# Patient Record
Sex: Male | Born: 1950 | Race: White | Hispanic: No | Smoking: Heavy tobacco smoker
Health system: Southern US, Community
[De-identification: ages and names within clinical notes are randomized; demographics above are authoritative.]

## PROBLEM LIST (undated history)

## (undated) DIAGNOSIS — E119 Type 2 diabetes mellitus without complications: Secondary | ICD-10-CM

## (undated) DIAGNOSIS — I10 Essential (primary) hypertension: Secondary | ICD-10-CM

## (undated) DIAGNOSIS — E785 Hyperlipidemia, unspecified: Secondary | ICD-10-CM

## (undated) HISTORY — PX: CORONARY ANGIOPLASTY WITH STENT PLACEMENT: SHX49

## (undated) HISTORY — PX: CORONARY ARTERY BYPASS GRAFT: SHX141

---

## 2014-11-29 DIAGNOSIS — E119 Type 2 diabetes mellitus without complications: Secondary | ICD-10-CM | POA: Diagnosis not present

## 2014-11-29 DIAGNOSIS — Z6828 Body mass index (BMI) 28.0-28.9, adult: Secondary | ICD-10-CM | POA: Diagnosis not present

## 2014-11-29 DIAGNOSIS — E785 Hyperlipidemia, unspecified: Secondary | ICD-10-CM | POA: Diagnosis not present

## 2014-11-29 DIAGNOSIS — I10 Essential (primary) hypertension: Secondary | ICD-10-CM | POA: Diagnosis not present

## 2015-01-04 DIAGNOSIS — E1165 Type 2 diabetes mellitus with hyperglycemia: Secondary | ICD-10-CM | POA: Diagnosis not present

## 2015-04-03 DIAGNOSIS — E785 Hyperlipidemia, unspecified: Secondary | ICD-10-CM | POA: Diagnosis not present

## 2015-04-03 DIAGNOSIS — I10 Essential (primary) hypertension: Secondary | ICD-10-CM | POA: Diagnosis not present

## 2015-04-03 DIAGNOSIS — N400 Enlarged prostate without lower urinary tract symptoms: Secondary | ICD-10-CM | POA: Diagnosis not present

## 2015-04-03 DIAGNOSIS — I2510 Atherosclerotic heart disease of native coronary artery without angina pectoris: Secondary | ICD-10-CM | POA: Diagnosis not present

## 2015-04-03 DIAGNOSIS — K210 Gastro-esophageal reflux disease with esophagitis: Secondary | ICD-10-CM | POA: Diagnosis not present

## 2015-04-03 DIAGNOSIS — I253 Aneurysm of heart: Secondary | ICD-10-CM | POA: Diagnosis not present

## 2015-04-03 DIAGNOSIS — E119 Type 2 diabetes mellitus without complications: Secondary | ICD-10-CM | POA: Diagnosis not present

## 2015-04-03 DIAGNOSIS — Z6826 Body mass index (BMI) 26.0-26.9, adult: Secondary | ICD-10-CM | POA: Diagnosis not present

## 2015-10-08 DIAGNOSIS — I10 Essential (primary) hypertension: Secondary | ICD-10-CM | POA: Diagnosis not present

## 2015-10-08 DIAGNOSIS — I2510 Atherosclerotic heart disease of native coronary artery without angina pectoris: Secondary | ICD-10-CM | POA: Diagnosis not present

## 2015-10-08 DIAGNOSIS — Z720 Tobacco use: Secondary | ICD-10-CM | POA: Diagnosis not present

## 2015-10-08 DIAGNOSIS — E785 Hyperlipidemia, unspecified: Secondary | ICD-10-CM | POA: Diagnosis not present

## 2015-10-08 DIAGNOSIS — E119 Type 2 diabetes mellitus without complications: Secondary | ICD-10-CM | POA: Diagnosis not present

## 2015-10-08 DIAGNOSIS — Z6826 Body mass index (BMI) 26.0-26.9, adult: Secondary | ICD-10-CM | POA: Diagnosis not present

## 2016-01-24 DIAGNOSIS — E119 Type 2 diabetes mellitus without complications: Secondary | ICD-10-CM | POA: Diagnosis not present

## 2016-01-25 DIAGNOSIS — Z6827 Body mass index (BMI) 27.0-27.9, adult: Secondary | ICD-10-CM | POA: Diagnosis not present

## 2016-01-25 DIAGNOSIS — Z1389 Encounter for screening for other disorder: Secondary | ICD-10-CM | POA: Diagnosis not present

## 2016-01-25 DIAGNOSIS — E663 Overweight: Secondary | ICD-10-CM | POA: Diagnosis not present

## 2016-01-25 DIAGNOSIS — E119 Type 2 diabetes mellitus without complications: Secondary | ICD-10-CM | POA: Diagnosis not present

## 2016-01-25 DIAGNOSIS — I2510 Atherosclerotic heart disease of native coronary artery without angina pectoris: Secondary | ICD-10-CM | POA: Diagnosis not present

## 2016-01-25 DIAGNOSIS — E785 Hyperlipidemia, unspecified: Secondary | ICD-10-CM | POA: Diagnosis not present

## 2016-01-25 DIAGNOSIS — I10 Essential (primary) hypertension: Secondary | ICD-10-CM | POA: Diagnosis not present

## 2016-05-27 DIAGNOSIS — E663 Overweight: Secondary | ICD-10-CM | POA: Diagnosis not present

## 2016-05-27 DIAGNOSIS — E119 Type 2 diabetes mellitus without complications: Secondary | ICD-10-CM | POA: Diagnosis not present

## 2016-05-27 DIAGNOSIS — Z6827 Body mass index (BMI) 27.0-27.9, adult: Secondary | ICD-10-CM | POA: Diagnosis not present

## 2016-05-27 DIAGNOSIS — Z1389 Encounter for screening for other disorder: Secondary | ICD-10-CM | POA: Diagnosis not present

## 2016-05-27 DIAGNOSIS — E785 Hyperlipidemia, unspecified: Secondary | ICD-10-CM | POA: Diagnosis not present

## 2016-05-27 DIAGNOSIS — I10 Essential (primary) hypertension: Secondary | ICD-10-CM | POA: Diagnosis not present

## 2016-09-30 DIAGNOSIS — E663 Overweight: Secondary | ICD-10-CM | POA: Diagnosis not present

## 2016-09-30 DIAGNOSIS — Z6827 Body mass index (BMI) 27.0-27.9, adult: Secondary | ICD-10-CM | POA: Diagnosis not present

## 2016-09-30 DIAGNOSIS — E119 Type 2 diabetes mellitus without complications: Secondary | ICD-10-CM | POA: Diagnosis not present

## 2016-09-30 DIAGNOSIS — I10 Essential (primary) hypertension: Secondary | ICD-10-CM | POA: Diagnosis not present

## 2017-01-29 DIAGNOSIS — E785 Hyperlipidemia, unspecified: Secondary | ICD-10-CM | POA: Diagnosis not present

## 2017-01-29 DIAGNOSIS — Z9181 History of falling: Secondary | ICD-10-CM | POA: Diagnosis not present

## 2017-01-29 DIAGNOSIS — I10 Essential (primary) hypertension: Secondary | ICD-10-CM | POA: Diagnosis not present

## 2017-01-29 DIAGNOSIS — Z6827 Body mass index (BMI) 27.0-27.9, adult: Secondary | ICD-10-CM | POA: Diagnosis not present

## 2017-01-29 DIAGNOSIS — E119 Type 2 diabetes mellitus without complications: Secondary | ICD-10-CM | POA: Diagnosis not present

## 2017-01-29 DIAGNOSIS — E663 Overweight: Secondary | ICD-10-CM | POA: Diagnosis not present

## 2017-01-29 DIAGNOSIS — K219 Gastro-esophageal reflux disease without esophagitis: Secondary | ICD-10-CM | POA: Diagnosis not present

## 2017-01-29 DIAGNOSIS — Z79899 Other long term (current) drug therapy: Secondary | ICD-10-CM | POA: Diagnosis not present

## 2017-03-06 DIAGNOSIS — Z6827 Body mass index (BMI) 27.0-27.9, adult: Secondary | ICD-10-CM | POA: Diagnosis not present

## 2017-03-06 DIAGNOSIS — Z7689 Persons encountering health services in other specified circumstances: Secondary | ICD-10-CM | POA: Diagnosis not present

## 2017-03-06 DIAGNOSIS — E663 Overweight: Secondary | ICD-10-CM | POA: Diagnosis not present

## 2017-03-06 DIAGNOSIS — I10 Essential (primary) hypertension: Secondary | ICD-10-CM | POA: Diagnosis not present

## 2017-03-06 DIAGNOSIS — E119 Type 2 diabetes mellitus without complications: Secondary | ICD-10-CM | POA: Diagnosis not present

## 2017-06-01 DIAGNOSIS — I10 Essential (primary) hypertension: Secondary | ICD-10-CM | POA: Diagnosis not present

## 2017-06-01 DIAGNOSIS — E119 Type 2 diabetes mellitus without complications: Secondary | ICD-10-CM | POA: Diagnosis not present

## 2017-06-04 DIAGNOSIS — Z6826 Body mass index (BMI) 26.0-26.9, adult: Secondary | ICD-10-CM | POA: Diagnosis not present

## 2017-06-04 DIAGNOSIS — K219 Gastro-esophageal reflux disease without esophagitis: Secondary | ICD-10-CM | POA: Diagnosis not present

## 2017-06-04 DIAGNOSIS — E119 Type 2 diabetes mellitus without complications: Secondary | ICD-10-CM | POA: Diagnosis not present

## 2017-06-04 DIAGNOSIS — Z1389 Encounter for screening for other disorder: Secondary | ICD-10-CM | POA: Diagnosis not present

## 2017-06-04 DIAGNOSIS — Z9181 History of falling: Secondary | ICD-10-CM | POA: Diagnosis not present

## 2017-06-04 DIAGNOSIS — I10 Essential (primary) hypertension: Secondary | ICD-10-CM | POA: Diagnosis not present

## 2017-11-11 DIAGNOSIS — E663 Overweight: Secondary | ICD-10-CM | POA: Diagnosis not present

## 2017-11-11 DIAGNOSIS — E785 Hyperlipidemia, unspecified: Secondary | ICD-10-CM | POA: Diagnosis not present

## 2017-11-11 DIAGNOSIS — Z6826 Body mass index (BMI) 26.0-26.9, adult: Secondary | ICD-10-CM | POA: Diagnosis not present

## 2017-11-11 DIAGNOSIS — E119 Type 2 diabetes mellitus without complications: Secondary | ICD-10-CM | POA: Diagnosis not present

## 2017-11-11 DIAGNOSIS — I10 Essential (primary) hypertension: Secondary | ICD-10-CM | POA: Diagnosis not present

## 2018-05-11 DIAGNOSIS — K219 Gastro-esophageal reflux disease without esophagitis: Secondary | ICD-10-CM | POA: Diagnosis not present

## 2018-05-11 DIAGNOSIS — I10 Essential (primary) hypertension: Secondary | ICD-10-CM | POA: Diagnosis not present

## 2018-05-11 DIAGNOSIS — E119 Type 2 diabetes mellitus without complications: Secondary | ICD-10-CM | POA: Diagnosis not present

## 2018-05-11 DIAGNOSIS — E785 Hyperlipidemia, unspecified: Secondary | ICD-10-CM | POA: Diagnosis not present

## 2018-11-23 DIAGNOSIS — E785 Hyperlipidemia, unspecified: Secondary | ICD-10-CM | POA: Diagnosis not present

## 2018-11-23 DIAGNOSIS — E119 Type 2 diabetes mellitus without complications: Secondary | ICD-10-CM | POA: Diagnosis not present

## 2018-11-23 DIAGNOSIS — Z1331 Encounter for screening for depression: Secondary | ICD-10-CM | POA: Diagnosis not present

## 2018-11-23 DIAGNOSIS — Z6824 Body mass index (BMI) 24.0-24.9, adult: Secondary | ICD-10-CM | POA: Diagnosis not present

## 2018-11-23 DIAGNOSIS — Z9181 History of falling: Secondary | ICD-10-CM | POA: Diagnosis not present

## 2018-11-23 DIAGNOSIS — I10 Essential (primary) hypertension: Secondary | ICD-10-CM | POA: Diagnosis not present

## 2019-06-02 DIAGNOSIS — Z139 Encounter for screening, unspecified: Secondary | ICD-10-CM | POA: Diagnosis not present

## 2019-06-02 DIAGNOSIS — H6123 Impacted cerumen, bilateral: Secondary | ICD-10-CM | POA: Diagnosis not present

## 2019-06-02 DIAGNOSIS — E118 Type 2 diabetes mellitus with unspecified complications: Secondary | ICD-10-CM | POA: Diagnosis not present

## 2019-06-02 DIAGNOSIS — I10 Essential (primary) hypertension: Secondary | ICD-10-CM | POA: Diagnosis not present

## 2019-06-02 DIAGNOSIS — E785 Hyperlipidemia, unspecified: Secondary | ICD-10-CM | POA: Diagnosis not present

## 2019-06-02 DIAGNOSIS — Z125 Encounter for screening for malignant neoplasm of prostate: Secondary | ICD-10-CM | POA: Diagnosis not present

## 2019-06-03 DIAGNOSIS — H6123 Impacted cerumen, bilateral: Secondary | ICD-10-CM | POA: Diagnosis not present

## 2019-06-03 DIAGNOSIS — H9202 Otalgia, left ear: Secondary | ICD-10-CM | POA: Diagnosis not present

## 2019-07-04 DIAGNOSIS — Z6825 Body mass index (BMI) 25.0-25.9, adult: Secondary | ICD-10-CM | POA: Diagnosis not present

## 2019-07-04 DIAGNOSIS — I10 Essential (primary) hypertension: Secondary | ICD-10-CM | POA: Diagnosis not present

## 2019-07-14 DIAGNOSIS — H6123 Impacted cerumen, bilateral: Secondary | ICD-10-CM | POA: Diagnosis not present

## 2019-07-14 DIAGNOSIS — H61303 Acquired stenosis of external ear canal, unspecified, bilateral: Secondary | ICD-10-CM | POA: Diagnosis not present

## 2019-07-14 DIAGNOSIS — H9190 Unspecified hearing loss, unspecified ear: Secondary | ICD-10-CM | POA: Diagnosis not present

## 2019-07-26 DIAGNOSIS — H61303 Acquired stenosis of external ear canal, unspecified, bilateral: Secondary | ICD-10-CM | POA: Diagnosis not present

## 2019-07-26 DIAGNOSIS — H6123 Impacted cerumen, bilateral: Secondary | ICD-10-CM | POA: Diagnosis not present

## 2019-08-16 DIAGNOSIS — H6123 Impacted cerumen, bilateral: Secondary | ICD-10-CM | POA: Diagnosis not present

## 2019-10-07 DIAGNOSIS — I10 Essential (primary) hypertension: Secondary | ICD-10-CM | POA: Diagnosis not present

## 2019-10-07 DIAGNOSIS — Z720 Tobacco use: Secondary | ICD-10-CM | POA: Diagnosis not present

## 2019-10-07 DIAGNOSIS — E785 Hyperlipidemia, unspecified: Secondary | ICD-10-CM | POA: Diagnosis not present

## 2019-10-07 DIAGNOSIS — Z6825 Body mass index (BMI) 25.0-25.9, adult: Secondary | ICD-10-CM | POA: Diagnosis not present

## 2019-10-07 DIAGNOSIS — E118 Type 2 diabetes mellitus with unspecified complications: Secondary | ICD-10-CM | POA: Diagnosis not present

## 2020-01-05 DIAGNOSIS — E785 Hyperlipidemia, unspecified: Secondary | ICD-10-CM | POA: Diagnosis not present

## 2020-01-05 DIAGNOSIS — Z6825 Body mass index (BMI) 25.0-25.9, adult: Secondary | ICD-10-CM | POA: Diagnosis not present

## 2020-01-05 DIAGNOSIS — E118 Type 2 diabetes mellitus with unspecified complications: Secondary | ICD-10-CM | POA: Diagnosis not present

## 2020-01-05 DIAGNOSIS — I10 Essential (primary) hypertension: Secondary | ICD-10-CM | POA: Diagnosis not present

## 2020-04-17 DIAGNOSIS — I2510 Atherosclerotic heart disease of native coronary artery without angina pectoris: Secondary | ICD-10-CM | POA: Diagnosis not present

## 2020-04-17 DIAGNOSIS — E785 Hyperlipidemia, unspecified: Secondary | ICD-10-CM | POA: Diagnosis not present

## 2020-04-17 DIAGNOSIS — Z6826 Body mass index (BMI) 26.0-26.9, adult: Secondary | ICD-10-CM | POA: Diagnosis not present

## 2020-04-17 DIAGNOSIS — Z720 Tobacco use: Secondary | ICD-10-CM | POA: Diagnosis not present

## 2020-04-17 DIAGNOSIS — I252 Old myocardial infarction: Secondary | ICD-10-CM | POA: Diagnosis not present

## 2020-04-17 DIAGNOSIS — I10 Essential (primary) hypertension: Secondary | ICD-10-CM | POA: Diagnosis not present

## 2020-04-17 DIAGNOSIS — Z1331 Encounter for screening for depression: Secondary | ICD-10-CM | POA: Diagnosis not present

## 2020-04-17 DIAGNOSIS — E118 Type 2 diabetes mellitus with unspecified complications: Secondary | ICD-10-CM | POA: Diagnosis not present

## 2020-08-21 DIAGNOSIS — K219 Gastro-esophageal reflux disease without esophagitis: Secondary | ICD-10-CM | POA: Diagnosis not present

## 2020-08-21 DIAGNOSIS — I252 Old myocardial infarction: Secondary | ICD-10-CM | POA: Diagnosis not present

## 2020-08-21 DIAGNOSIS — Z720 Tobacco use: Secondary | ICD-10-CM | POA: Diagnosis not present

## 2020-08-21 DIAGNOSIS — I2510 Atherosclerotic heart disease of native coronary artery without angina pectoris: Secondary | ICD-10-CM | POA: Diagnosis not present

## 2020-08-21 DIAGNOSIS — E118 Type 2 diabetes mellitus with unspecified complications: Secondary | ICD-10-CM | POA: Diagnosis not present

## 2020-08-21 DIAGNOSIS — E785 Hyperlipidemia, unspecified: Secondary | ICD-10-CM | POA: Diagnosis not present

## 2020-08-21 DIAGNOSIS — Z6825 Body mass index (BMI) 25.0-25.9, adult: Secondary | ICD-10-CM | POA: Diagnosis not present

## 2020-08-21 DIAGNOSIS — I10 Essential (primary) hypertension: Secondary | ICD-10-CM | POA: Diagnosis not present

## 2020-10-11 ENCOUNTER — Encounter (HOSPITAL_COMMUNITY): Payer: Self-pay | Admitting: Emergency Medicine

## 2020-10-11 ENCOUNTER — Inpatient Hospital Stay (HOSPITAL_COMMUNITY)
Admission: EM | Admit: 2020-10-11 | Discharge: 2020-11-20 | DRG: 177 | Disposition: E | Payer: Medicare Other | Attending: Family Medicine | Admitting: Family Medicine

## 2020-10-11 ENCOUNTER — Emergency Department (HOSPITAL_COMMUNITY): Payer: Medicare Other

## 2020-10-11 ENCOUNTER — Other Ambulatory Visit: Payer: Self-pay

## 2020-10-11 DIAGNOSIS — R5381 Other malaise: Secondary | ICD-10-CM | POA: Diagnosis not present

## 2020-10-11 DIAGNOSIS — E538 Deficiency of other specified B group vitamins: Secondary | ICD-10-CM | POA: Diagnosis not present

## 2020-10-11 DIAGNOSIS — I959 Hypotension, unspecified: Secondary | ICD-10-CM | POA: Diagnosis not present

## 2020-10-11 DIAGNOSIS — J1282 Pneumonia due to coronavirus disease 2019: Secondary | ICD-10-CM | POA: Diagnosis present

## 2020-10-11 DIAGNOSIS — E861 Hypovolemia: Secondary | ICD-10-CM | POA: Diagnosis present

## 2020-10-11 DIAGNOSIS — R531 Weakness: Secondary | ICD-10-CM | POA: Diagnosis not present

## 2020-10-11 DIAGNOSIS — G928 Other toxic encephalopathy: Secondary | ICD-10-CM | POA: Diagnosis not present

## 2020-10-11 DIAGNOSIS — E785 Hyperlipidemia, unspecified: Secondary | ICD-10-CM | POA: Diagnosis present

## 2020-10-11 DIAGNOSIS — N189 Chronic kidney disease, unspecified: Secondary | ICD-10-CM

## 2020-10-11 DIAGNOSIS — J159 Unspecified bacterial pneumonia: Secondary | ICD-10-CM | POA: Diagnosis present

## 2020-10-11 DIAGNOSIS — R0902 Hypoxemia: Secondary | ICD-10-CM

## 2020-10-11 DIAGNOSIS — H748X3 Other specified disorders of middle ear and mastoid, bilateral: Secondary | ICD-10-CM | POA: Diagnosis not present

## 2020-10-11 DIAGNOSIS — I82611 Acute embolism and thrombosis of superficial veins of right upper extremity: Secondary | ICD-10-CM | POA: Diagnosis not present

## 2020-10-11 DIAGNOSIS — Z7982 Long term (current) use of aspirin: Secondary | ICD-10-CM

## 2020-10-11 DIAGNOSIS — J9601 Acute respiratory failure with hypoxia: Secondary | ICD-10-CM | POA: Diagnosis not present

## 2020-10-11 DIAGNOSIS — E1122 Type 2 diabetes mellitus with diabetic chronic kidney disease: Secondary | ICD-10-CM | POA: Diagnosis not present

## 2020-10-11 DIAGNOSIS — Z7189 Other specified counseling: Secondary | ICD-10-CM | POA: Diagnosis not present

## 2020-10-11 DIAGNOSIS — K219 Gastro-esophageal reflux disease without esophagitis: Secondary | ICD-10-CM | POA: Diagnosis present

## 2020-10-11 DIAGNOSIS — Z79899 Other long term (current) drug therapy: Secondary | ICD-10-CM | POA: Diagnosis not present

## 2020-10-11 DIAGNOSIS — R609 Edema, unspecified: Secondary | ICD-10-CM | POA: Diagnosis not present

## 2020-10-11 DIAGNOSIS — Z888 Allergy status to other drugs, medicaments and biological substances status: Secondary | ICD-10-CM | POA: Diagnosis not present

## 2020-10-11 DIAGNOSIS — U071 COVID-19: Secondary | ICD-10-CM | POA: Diagnosis not present

## 2020-10-11 DIAGNOSIS — N183 Chronic kidney disease, stage 3 unspecified: Secondary | ICD-10-CM

## 2020-10-11 DIAGNOSIS — Z515 Encounter for palliative care: Secondary | ICD-10-CM | POA: Diagnosis not present

## 2020-10-11 DIAGNOSIS — Z66 Do not resuscitate: Secondary | ICD-10-CM | POA: Diagnosis not present

## 2020-10-11 DIAGNOSIS — E870 Hyperosmolality and hypernatremia: Secondary | ICD-10-CM | POA: Diagnosis present

## 2020-10-11 DIAGNOSIS — E860 Dehydration: Secondary | ICD-10-CM | POA: Diagnosis present

## 2020-10-11 DIAGNOSIS — R410 Disorientation, unspecified: Secondary | ICD-10-CM | POA: Diagnosis not present

## 2020-10-11 DIAGNOSIS — G9341 Metabolic encephalopathy: Secondary | ICD-10-CM | POA: Diagnosis not present

## 2020-10-11 DIAGNOSIS — Z7984 Long term (current) use of oral hypoglycemic drugs: Secondary | ICD-10-CM

## 2020-10-11 DIAGNOSIS — E876 Hypokalemia: Secondary | ICD-10-CM | POA: Diagnosis not present

## 2020-10-11 DIAGNOSIS — E1165 Type 2 diabetes mellitus with hyperglycemia: Secondary | ICD-10-CM | POA: Diagnosis not present

## 2020-10-11 DIAGNOSIS — Z951 Presence of aortocoronary bypass graft: Secondary | ICD-10-CM | POA: Diagnosis not present

## 2020-10-11 DIAGNOSIS — F17210 Nicotine dependence, cigarettes, uncomplicated: Secondary | ICD-10-CM | POA: Diagnosis present

## 2020-10-11 DIAGNOSIS — J189 Pneumonia, unspecified organism: Secondary | ICD-10-CM | POA: Diagnosis not present

## 2020-10-11 DIAGNOSIS — G319 Degenerative disease of nervous system, unspecified: Secondary | ICD-10-CM | POA: Diagnosis not present

## 2020-10-11 DIAGNOSIS — D6959 Other secondary thrombocytopenia: Secondary | ICD-10-CM | POA: Diagnosis present

## 2020-10-11 DIAGNOSIS — J690 Pneumonitis due to inhalation of food and vomit: Secondary | ICD-10-CM | POA: Diagnosis not present

## 2020-10-11 DIAGNOSIS — I252 Old myocardial infarction: Secondary | ICD-10-CM | POA: Diagnosis not present

## 2020-10-11 DIAGNOSIS — N179 Acute kidney failure, unspecified: Secondary | ICD-10-CM | POA: Diagnosis present

## 2020-10-11 DIAGNOSIS — I10 Essential (primary) hypertension: Secondary | ICD-10-CM | POA: Diagnosis present

## 2020-10-11 DIAGNOSIS — R509 Fever, unspecified: Secondary | ICD-10-CM | POA: Diagnosis not present

## 2020-10-11 DIAGNOSIS — R059 Cough, unspecified: Secondary | ICD-10-CM | POA: Diagnosis not present

## 2020-10-11 DIAGNOSIS — R7989 Other specified abnormal findings of blood chemistry: Secondary | ICD-10-CM | POA: Diagnosis not present

## 2020-10-11 DIAGNOSIS — I517 Cardiomegaly: Secondary | ICD-10-CM | POA: Diagnosis not present

## 2020-10-11 DIAGNOSIS — R0602 Shortness of breath: Secondary | ICD-10-CM

## 2020-10-11 DIAGNOSIS — I472 Ventricular tachycardia: Secondary | ICD-10-CM | POA: Diagnosis not present

## 2020-10-11 DIAGNOSIS — R001 Bradycardia, unspecified: Secondary | ICD-10-CM | POA: Diagnosis not present

## 2020-10-11 DIAGNOSIS — J3489 Other specified disorders of nose and nasal sinuses: Secondary | ICD-10-CM | POA: Diagnosis not present

## 2020-10-11 DIAGNOSIS — R1310 Dysphagia, unspecified: Secondary | ICD-10-CM | POA: Diagnosis not present

## 2020-10-11 DIAGNOSIS — E119 Type 2 diabetes mellitus without complications: Secondary | ICD-10-CM | POA: Diagnosis not present

## 2020-10-11 DIAGNOSIS — R4182 Altered mental status, unspecified: Secondary | ICD-10-CM | POA: Diagnosis not present

## 2020-10-11 HISTORY — DX: Hyperlipidemia, unspecified: E78.5

## 2020-10-11 HISTORY — DX: Type 2 diabetes mellitus without complications: E11.9

## 2020-10-11 HISTORY — DX: Essential (primary) hypertension: I10

## 2020-10-11 LAB — D-DIMER, QUANTITATIVE
D-Dimer, Quant: 1.48 ug/mL-FEU — ABNORMAL HIGH (ref 0.00–0.50)
D-Dimer, Quant: 1.6 ug/mL-FEU — ABNORMAL HIGH (ref 0.00–0.50)

## 2020-10-11 LAB — FERRITIN: Ferritin: 343 ng/mL — ABNORMAL HIGH (ref 24–336)

## 2020-10-11 LAB — CBC WITH DIFFERENTIAL/PLATELET
Abs Immature Granulocytes: 0.21 10*3/uL — ABNORMAL HIGH (ref 0.00–0.07)
Basophils Absolute: 0 10*3/uL (ref 0.0–0.1)
Basophils Relative: 0 %
Eosinophils Absolute: 0 10*3/uL (ref 0.0–0.5)
Eosinophils Relative: 0 %
HCT: 36.3 % — ABNORMAL LOW (ref 39.0–52.0)
Hemoglobin: 13 g/dL (ref 13.0–17.0)
Immature Granulocytes: 3 %
Lymphocytes Relative: 19 %
Lymphs Abs: 1.4 10*3/uL (ref 0.7–4.0)
MCH: 38.6 pg — ABNORMAL HIGH (ref 26.0–34.0)
MCHC: 35.8 g/dL (ref 30.0–36.0)
MCV: 107.7 fL — ABNORMAL HIGH (ref 80.0–100.0)
Monocytes Absolute: 0.7 10*3/uL (ref 0.1–1.0)
Monocytes Relative: 9 %
Neutro Abs: 5.3 10*3/uL (ref 1.7–7.7)
Neutrophils Relative %: 69 %
Platelets: 125 10*3/uL — ABNORMAL LOW (ref 150–400)
RBC: 3.37 MIL/uL — ABNORMAL LOW (ref 4.22–5.81)
RDW: 18.7 % — ABNORMAL HIGH (ref 11.5–15.5)
WBC: 7.6 10*3/uL (ref 4.0–10.5)
nRBC: 0.9 % — ABNORMAL HIGH (ref 0.0–0.2)

## 2020-10-11 LAB — COMPREHENSIVE METABOLIC PANEL
ALT: 18 U/L (ref 0–44)
AST: 23 U/L (ref 15–41)
Albumin: 3 g/dL — ABNORMAL LOW (ref 3.5–5.0)
Alkaline Phosphatase: 21 U/L — ABNORMAL LOW (ref 38–126)
Anion gap: 16 — ABNORMAL HIGH (ref 5–15)
BUN: 53 mg/dL — ABNORMAL HIGH (ref 8–23)
CO2: 23 mmol/L (ref 22–32)
Calcium: 8.9 mg/dL (ref 8.9–10.3)
Chloride: 107 mmol/L (ref 98–111)
Creatinine, Ser: 2.64 mg/dL — ABNORMAL HIGH (ref 0.61–1.24)
GFR, Estimated: 25 mL/min — ABNORMAL LOW (ref >60)
Glucose, Bld: 149 mg/dL — ABNORMAL HIGH (ref 70–99)
Potassium: 3.6 mmol/L (ref 3.5–5.1)
Sodium: 146 mmol/L — ABNORMAL HIGH (ref 135–145)
Total Bilirubin: 1.6 mg/dL — ABNORMAL HIGH (ref 0.3–1.2)
Total Protein: 6.7 g/dL (ref 6.5–8.1)

## 2020-10-11 LAB — TRIGLYCERIDES: Triglycerides: 269 mg/dL — ABNORMAL HIGH (ref <150)

## 2020-10-11 LAB — PROCALCITONIN: Procalcitonin: 0.2 ng/mL

## 2020-10-11 LAB — C-REACTIVE PROTEIN: CRP: 22.7 mg/dL — ABNORMAL HIGH (ref <1.0)

## 2020-10-11 LAB — FIBRINOGEN
Fibrinogen: 473 mg/dL (ref 210–475)
Fibrinogen: 402 mg/dL (ref 210–475)

## 2020-10-11 LAB — LACTIC ACID, PLASMA: Lactic Acid, Venous: 1.2 mmol/L (ref 0.5–1.9)

## 2020-10-11 LAB — LACTATE DEHYDROGENASE: LDH: 167 U/L (ref 98–192)

## 2020-10-11 LAB — SARS CORONAVIRUS 2 BY RT PCR (HOSPITAL ORDER, PERFORMED IN ~~LOC~~ HOSPITAL LAB): SARS Coronavirus 2: POSITIVE — AB

## 2020-10-11 MED ORDER — POLYETHYLENE GLYCOL 3350 17 G PO PACK: 17.0000 g | PACK | Freq: Every day | ORAL | Status: DC | PRN

## 2020-10-11 MED ORDER — METHYLPREDNISOLONE SODIUM SUCC 125 MG IJ SOLR
1.0000 mg/kg | Freq: Two times a day (BID) | INTRAMUSCULAR | Status: DC
  Administered 2020-10-11 – 2020-10-12 (×2): 79.375 mg via INTRAVENOUS
  Filled 2020-10-11 (×2): qty 2

## 2020-10-11 MED ORDER — PREDNISONE 20 MG PO TABS: 50.0000 mg | ORAL_TABLET | Freq: Every day | ORAL | Status: DC

## 2020-10-11 MED ORDER — LABETALOL HCL 5 MG/ML IV SOLN: 20.0000 mg | INTRAVENOUS | Status: DC | PRN

## 2020-10-11 MED ORDER — METOPROLOL SUCCINATE ER 25 MG PO TB24
25.0000 mg | ORAL_TABLET | Freq: Two times a day (BID) | ORAL | Status: DC
  Administered 2020-10-11 – 2020-10-14 (×7): 25 mg via ORAL
  Filled 2020-10-11 (×8): qty 1

## 2020-10-11 MED ORDER — GUAIFENESIN ER 600 MG PO TB12
600.0000 mg | ORAL_TABLET | Freq: Two times a day (BID) | ORAL | Status: DC
  Administered 2020-10-11 – 2020-10-14 (×7): 600 mg via ORAL
  Filled 2020-10-11 (×8): qty 1

## 2020-10-11 MED ORDER — TRAZODONE HCL 50 MG PO TABS: 25.0000 mg | ORAL_TABLET | Freq: Every evening | ORAL | Status: DC | PRN

## 2020-10-11 MED ORDER — ENOXAPARIN SODIUM 30 MG/0.3ML ~~LOC~~ SOLN
30.0000 mg | SUBCUTANEOUS | Status: DC
  Administered 2020-10-11: 30 mg via SUBCUTANEOUS
  Filled 2020-10-11: qty 0.3

## 2020-10-11 MED ORDER — ASPIRIN 325 MG PO TABS
325.0000 mg | ORAL_TABLET | Freq: Every day | ORAL | Status: DC
  Administered 2020-10-12 – 2020-10-14 (×3): 325 mg via ORAL
  Filled 2020-10-11 (×4): qty 1

## 2020-10-11 MED ORDER — DEXAMETHASONE SODIUM PHOSPHATE 10 MG/ML IJ SOLN
8.0000 mg | Freq: Once | INTRAMUSCULAR | Status: AC
  Administered 2020-10-11: 8 mg via INTRAVENOUS
  Filled 2020-10-11: qty 1

## 2020-10-11 MED ORDER — PANTOPRAZOLE SODIUM 20 MG PO TBEC
20.0000 mg | DELAYED_RELEASE_TABLET | Freq: Every day | ORAL | Status: DC
Start: 2020-10-12 — End: 2020-10-15
  Administered 2020-10-12 – 2020-10-14 (×3): 20 mg via ORAL
  Filled 2020-10-11 (×4): qty 1

## 2020-10-11 MED ORDER — MAGNESIUM HYDROXIDE 400 MG/5ML PO SUSP: 30.0000 mL | Freq: Every day | ORAL | Status: DC | PRN

## 2020-10-11 MED ORDER — FAMOTIDINE 20 MG PO TABS
20.0000 mg | ORAL_TABLET | Freq: Two times a day (BID) | ORAL | Status: DC
  Administered 2020-10-11 – 2020-10-14 (×7): 20 mg via ORAL
  Filled 2020-10-11 (×8): qty 1

## 2020-10-11 MED ORDER — ONDANSETRON HCL 4 MG/2ML IJ SOLN: 4.0000 mg | Freq: Four times a day (QID) | INTRAMUSCULAR | Status: DC | PRN

## 2020-10-11 MED ORDER — ONDANSETRON HCL 4 MG PO TABS: 4.0000 mg | ORAL_TABLET | Freq: Four times a day (QID) | ORAL | Status: DC | PRN

## 2020-10-11 MED ORDER — LOSARTAN POTASSIUM-HCTZ 50-12.5 MG PO TABS: 1.0000 | ORAL_TABLET | Freq: Every day | ORAL | Status: DC

## 2020-10-11 MED ORDER — FENOFIBRATE 160 MG PO TABS
160.0000 mg | ORAL_TABLET | Freq: Every day | ORAL | Status: DC
  Administered 2020-10-12 – 2020-10-14 (×3): 160 mg via ORAL
  Filled 2020-10-11 (×4): qty 1

## 2020-10-11 MED ORDER — ASCORBIC ACID 500 MG PO TABS
500.0000 mg | ORAL_TABLET | Freq: Every day | ORAL | Status: DC
  Administered 2020-10-12 – 2020-10-14 (×3): 500 mg via ORAL
  Filled 2020-10-11 (×4): qty 1

## 2020-10-11 MED ORDER — HYDROCOD POLST-CPM POLST ER 10-8 MG/5ML PO SUER
5.0000 mL | Freq: Two times a day (BID) | ORAL | Status: DC | PRN
  Administered 2020-10-13: 5 mL via ORAL
  Filled 2020-10-11: qty 5

## 2020-10-11 MED ORDER — VITAMIN D 25 MCG (1000 UNIT) PO TABS
1000.0000 [IU] | ORAL_TABLET | Freq: Every day | ORAL | Status: DC
  Administered 2020-10-11 – 2020-10-14 (×4): 1000 [IU] via ORAL
  Filled 2020-10-11 (×4): qty 1

## 2020-10-11 MED ORDER — HYDROCHLOROTHIAZIDE 12.5 MG PO CAPS: 12.5000 mg | ORAL_CAPSULE | Freq: Every day | ORAL | Status: DC

## 2020-10-11 MED ORDER — LOSARTAN POTASSIUM 50 MG PO TABS: 50.0000 mg | ORAL_TABLET | Freq: Every day | ORAL | Status: DC

## 2020-10-11 MED ORDER — SODIUM CHLORIDE 0.9 % IV SOLN
200.0000 mg | Freq: Once | INTRAVENOUS | Status: AC
  Administered 2020-10-11: 200 mg via INTRAVENOUS
  Filled 2020-10-11: qty 40

## 2020-10-11 MED ORDER — ASPIRIN EC 81 MG PO TBEC: 81.0000 mg | DELAYED_RELEASE_TABLET | Freq: Every day | ORAL | Status: DC

## 2020-10-11 MED ORDER — GUAIFENESIN-DM 100-10 MG/5ML PO SYRP
10.0000 mL | ORAL_SOLUTION | ORAL | Status: DC | PRN
  Administered 2020-10-13 – 2020-10-14 (×2): 10 mL via ORAL
  Filled 2020-10-11 (×3): qty 10

## 2020-10-11 MED ORDER — ZINC SULFATE 220 (50 ZN) MG PO CAPS
220.0000 mg | ORAL_CAPSULE | Freq: Every day | ORAL | Status: DC
  Administered 2020-10-12 – 2020-10-14 (×3): 220 mg via ORAL
  Filled 2020-10-11 (×4): qty 1

## 2020-10-11 MED ORDER — OMEGA-3-ACID ETHYL ESTERS 1 G PO CAPS
1.0000 g | ORAL_CAPSULE | Freq: Two times a day (BID) | ORAL | Status: DC
  Administered 2020-10-11 – 2020-10-14 (×6): 1 g via ORAL
  Filled 2020-10-11 (×9): qty 1

## 2020-10-11 MED ORDER — SODIUM CHLORIDE 0.9 % IV SOLN: INTRAVENOUS | Status: DC

## 2020-10-11 MED ORDER — SODIUM CHLORIDE 0.9 % IV SOLN
100.0000 mg | Freq: Every day | INTRAVENOUS | Status: AC
  Administered 2020-10-12 – 2020-10-15 (×4): 100 mg via INTRAVENOUS
  Filled 2020-10-11 (×4): qty 20

## 2020-10-11 MED ORDER — LACTATED RINGERS IV BOLUS
500.0000 mL | Freq: Once | INTRAVENOUS | Status: AC
  Administered 2020-10-11: 500 mL via INTRAVENOUS

## 2020-10-11 NOTE — ED Triage Notes (Signed)
Pt arrives to ED with via [facility] with c/o x5 days of weakness, lethargy, and failure to thrive. Per family pt has not been eating or moving around. Pt states he has had runny nose, sore throat and cough x5 days.

## 2020-10-11 NOTE — H&P (Addendum)
[facility]   PATIENT NAME: Joseph Crawford    MR#:  [id number]  DATE OF BIRTH:  Feb 14, 1951  DATE OF ADMISSION:  10/21/2020  PRIMARY CARE PHYSICIAN: No primary care provider on file.   REQUESTING/REFERRING PHYSICIAN: Gailen Shelter, PA  CHIEF COMPLAINT:   Chief Complaint  Patient presents with  . Weakness  . Fatigue    HISTORY OF PRESENT ILLNESS:  Joseph Crawford  is a 70 y.o. 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  He denied any nausea or vomiting or diarrhea.  No loss of taste or smell.  Has been feeling weak and tired with diminished appetite.  No chest pain or palpitations.  No dysuria, oliguria or hematuria or flank pain.  The patient has not been vaccinated against COVID-19.  Upon presentation to the ER, blood pressure was 91/60 with a respiratory rate of 33 with otherwise normal vital signs.  Pulse oximetry later dropped to 86% on room air.  Later on blood pressure was up to 117/59 and pulse oximetry was up to 100% on 2 L of O2 by nasal cannula.  Labs revealed mild hyponatremia 146 and a BUN of 53 with creatinine of 2.64 with no previous levels for comparison.  Ferritin was 343 and CRP 22.7 with procalcitonin 0.2 and CBC showed macrocytosis and thrombocytopenia.  D-dimer came back 1.48 and fibrinogen 473.  Blood cultures were drawn.  COVID-19 PCR came back positive.  Chest x-ray showed vague patchy airspace opacities in the left base.EKG showed normal sinus rhythm rate of 72 with nonspecific intraventricular conduction delay and T wave inversion laterally and inferiorly.  The patient was given 8 mg of IV Decadron, 500 mL IV lactated Ringer and IV remdesivir.  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  PAST SURGICAL HISTORY:  Four-vessel CABG  SOCIAL HISTORY:   Social History   Tobacco Use  . Smoking status: Not on file  . Smokeless tobacco: Not on file  Substance Use Topics  . Alcohol use: Not on file  He smokes less than half pack of cigarettes per day and has been smoking for long time.  No history of alcohol abuse or illicit drug use.  FAMILY HISTORY:  He denied any familial diseases.  DRUG ALLERGIES:   Allergies  Allergen Reactions  . Statins Other (See Comments)    Per pt: muscle loss and weakness     REVIEW OF SYSTEMS:   As per history of present illness. All pertinent systems were reviewed above. Constitutional, HEENT, cardiovascular, respiratory, GI, GU, musculoskeletal, neuro, psychiatric, endocrine, integumentary and hematologic systems were reviewed and are otherwise negative/unremarkable except for positive findings mentioned above in the HPI.   MEDICATIONS AT HOME:   Prior to Admission medications   Medication Sig Start Date End Date Taking? Authorizing Provider  aspirin 325 MG tablet Take 325 mg by mouth daily.   Yes [provider]  fenofibrate 160 MG tablet Take 160 mg by mouth daily. 09/18/20  Yes [provider]  losartan-hydrochlorothiazide (HYZAAR) 50-12.5 MG tablet Take 1 tablet by mouth daily.  10/03/20  Yes [provider]  metFORMIN (GLUCOPHAGE) 850 MG tablet Take 850 mg by mouth 2 (two) times daily. 07/20/20  Yes [provider]  metoprolol succinate (TOPROL-XL) 25 MG 24 hr tablet Take 25 mg by mouth 2 (two) times daily. 08/04/20  Yes [provider]  Omega-3 Fatty Acids (FISH OIL PO) Take 3 capsules by mouth daily.   Yes [provider]  pantoprazole (PROTONIX) 20 MG tablet Take  20 mg by mouth daily. 08/04/20  Yes [provider]      VITAL SIGNS:  Blood pressure 122/67, pulse 70, temperature 97.8 F (36.6 C), temperature source Oral, resp. rate (!) 24, height 6' (1.829 m), weight 79.4 kg, SpO2 99 %.  PHYSICAL EXAMINATION:  Physical Exam  GENERAL:  70 y.o.-year-old Caucasian male patient lying in the bed with mild respiratory distress with conversational dyspnea. EYES: Pupils equal, round, reactive to light and accommodation. No scleral icterus. Extraocular muscles intact.  HEENT: Head atraumatic, normocephalic. Oropharynx and nasopharynx clear.  NECK:  Supple, no jugular venous distention. No thyroid enlargement, no tenderness.  LUNGS: Diminished bibasal breath sounds with bibasal crackles more on the left base. CARDIOVASCULAR: Regular rate and rhythm, S1, S2 normal. No murmurs, rubs, or gallops.  ABDOMEN: Soft, nondistended, nontender. Bowel sounds present. No organomegaly or mass.  EXTREMITIES: No pedal edema, cyanosis, or clubbing.  NEUROLOGIC: Cranial nerves II through XII are intact. Muscle strength 5/5 in all extremities. Sensation intact. Gait not checked.  PSYCHIATRIC: The patient is alert and oriented x 3.  Normal affect and good eye contact. SKIN: No obvious rash, lesion, or ulcer.   LABORATORY PANEL:   CBC Recent Labs  Lab 2020-10-31 1612  WBC 7.6  HGB 13.0  HCT 36.3*  PLT 125*   ------------------------------------------------------------------------------------------------------------------  Chemistries  Recent Labs  Lab 10/31/20 1612  NA 146*  K 3.6  CL 107  CO2 23  GLUCOSE 149*  BUN 53*  CREATININE 2.64*  CALCIUM 8.9  AST 23  ALT 18  ALKPHOS 21*  BILITOT 1.6*   ------------------------------------------------------------------------------------------------------------------  Cardiac Enzymes No results for input(s): TROPONINI in the last 168  hours. ------------------------------------------------------------------------------------------------------------------  RADIOLOGY:  DG Chest Port 1 View  Result Date: 2020/10/31 CLINICAL DATA:  Shortness of breath, cough positive EXAM: PORTABLE CHEST 1 VIEW COMPARISON:  Chest x-ray 06/01/2013 FINDINGS: The heart size and mediastinal contours are unchanged. Aortic arch calcifications. Suggestion of a round well-defined 9 mm lesion overlying the right mid lung zone and ribs that is likely benign. Vague patchy airspace opacities at the left base. No focal consolidation. No pulmonary edema. No pleural effusion. No pneumothorax. No acute osseous abnormality. IMPRESSION: Vague patchy airspace opacities at the left base that could represent a combination of atelectasis and or infection/inflammation. Followup PA and lateral chest X-ray is recommended in 3-4 weeks following therapy to ensure resolution and exclude underlying malignancy. Electronically Signed   By: Tish Frederickson M.D.   On: 10/31/20 15:55      IMPRESSION AND PLAN:   1.  Acute hypoxemic respiratory failure secondary to COVID-19. -The patient will be admitted to a medically monitored isolation bed. -O2 protocol will be followed to keep O2 saturation above 93.   2.  Multifocal pneumonia secondary to COVID-19. -The patient will be admitted to an isolation monitored bed with droplet and contact precautions. -Given multifocal pneumonia we will empirically place the patient on IV Rocephin and Zithromax for possible bacterial superinfection only with elevated Procalcitonin. -The patient will be placed on scheduled  Mucinex and as needed Tussionex. -We will avoid nebulization as much as we can, give bronchodilator MDI if needed, and with deterioration of oxygenation try to avoid BiPAP/CPAP if possible.    -Will obtain sputum Gram stain culture and sensitivity and follow blood cultures. -O2 protocol will be followed. -We will follow CRP,  ferritin, LDH and D-dimer. -Will follow manual differential for ANC/ALC ratio as well as follow troponin I and daily CBC with manual differential and CMP. - Will place the patient on IV Remdesivir and IV steroid therapy with IV Solu-Medrol with elevated inflammatory markers. -The patient will be placed on vitamin D3, vitamin C, zinc sulfate, p.o. Pepcid and aspirin.  3.  Acute kidney injury, likely prerenal, likely hypovolemic, possibly superimposed on chronic kidney disease. - The patient will be placed on hydration with IV normal saline and will follow BMP. - We will hold off nephrotoxins.  4.  Type 2 diabetes mellitus possibly with chronic kidney disease. - The patient will be placed on supplement coverage with NovoLog. - We will hold off his metformin.  5.  Dyslipidemia. - We will continue fenofibrate and fish oil.  6.  Hypertension. - We will continue his Toprol-XL and hold off his Hyzaar.  7.  GERD. - We will continue PPI therapy.  8.  DVT prophylaxis. - Subcutaneous Lovenox.    All the records are reviewed and case discussed with ED provider. The plan of care was discussed in details with the patient (and family). I answered all questions. The patient agreed to proceed with the above mentioned plan. Further management will depend upon hospital course.   CODE STATUS: Full code  Status is: Inpatient  Remains inpatient appropriate because:Ongoing diagnostic testing needed not appropriate for outpatient work up, Unsafe d/c plan, IV treatments appropriate due to intensity of illness or inability to take PO and Inpatient level of care appropriate due to severity of illness   Dispo: The patient is from: Home              Anticipated d/c is to: Home              Anticipated d/c date is: > 3 days              Patient currently is not medically stable to d/c.   TOTAL TIME TAKING CARE OF THIS PATIENT: 55 minutes.    Hannah Beat M.D on 2020-11-02 at 8:11 PM  [facility]   From 7 PM-7 AM, contact night-coverage [email or web address]  CC: Primary care physician; No primary care provider on file.

## 2020-10-11 NOTE — ED Notes (Signed)
Attempted to call report, receiving RN unavailable at this time. Number left, she is to call back.

## 2020-10-11 NOTE — ED Notes (Signed)
Transport requested for patient.  

## 2020-10-11 NOTE — ED Provider Notes (Signed)
[facility] EMERGENCY DEPARTMENT Provider Note   CSN: [id number] Arrival date & time: 08/31/21  1353     History Chief Complaint  Patient presents with   Weakness   Fatigue    Rosine AbeBruce Rasch is a 70 y.o. male.  HPI Patient is a 70 year old male with a history of , HTN, HLD, DM, MI (CABG ~8 years ago). No inhalers.   Discussed with Misty StanleyLisa who is patient's daughter at [phone] Per Misty StanleyLisa patient is a heavy smoker 1-2 packs/day for many years.  States that he has had some steady decrease in how much he is willing to eat this is been ongoing since September but she states over the past 7 days he has eaten significantly less food and is not eating anything over the past 24 hours.  She states that he has also started using a walker over the past few days was brought to his family medicine doctor and found to be orthostatic and had his blood pressure medicines decreased. States that today patient attempted to use the restroom but was unable to get his pants down and urinated in his pants. Patient is not vaccinated for COVID.  Daughter also states that he has been coughing somewhat more over the past week although he does have a chronic smoker's cough.  She states he has never worn oxygen before. Patient is brought in by [facility] found his oxygen to be in the 88-90% range.   5 days ago losartan/hctz was changed from 100 --> 25, metoprolol not changed.      History reviewed. No pertinent past medical history.  Patient Active Problem List   Diagnosis Date Noted   Acute hypoxemic respiratory failure due to COVID-19 [facility](HCC) 11-27-20    History reviewed. No pertinent surgical history.     History reviewed. No pertinent family history.     Home Medications Prior to Admission medications   Medication Sig Start Date End Date Taking? Authorizing Provider  aspirin 325 MG tablet Take 325 mg by mouth daily.   Yes [provider]  fenofibrate 160 MG tablet Take  160 mg by mouth daily. 09/18/20  Yes [provider]  losartan-hydrochlorothiazide (HYZAAR) 50-12.5 MG tablet Take 1 tablet by mouth daily. 10/03/20  Yes [provider]  metFORMIN (GLUCOPHAGE) 850 MG tablet Take 850 mg by mouth 2 (two) times daily. 07/20/20  Yes [provider]  metoprolol succinate (TOPROL-XL) 25 MG 24 hr tablet Take 25 mg by mouth 2 (two) times daily. 08/04/20  Yes [provider]  Omega-3 Fatty Acids (FISH OIL PO) Take 3 capsules by mouth daily.   Yes [provider]  pantoprazole (PROTONIX) 20 MG tablet Take 20 mg by mouth daily. 08/04/20  Yes [provider]    Allergies    Statins  Review of Systems   Review of Systems  Constitutional: Positive for activity change, appetite change and fatigue. Negative for chills and fever.  HENT: Negative for congestion.   Eyes: Negative for pain.  Respiratory: Positive for cough. Negative for shortness of breath.   Cardiovascular: Negative for chest pain and leg swelling.  Gastrointestinal: Negative for abdominal pain, diarrhea, nausea and vomiting.  Genitourinary: Negative for dysuria.  Musculoskeletal: Negative for myalgias.  Skin: Negative for rash.  Neurological: Positive for weakness. Negative for dizziness and headaches.    Physical Exam Updated Vital Signs BP (!) 88/54    Pulse 65    Temp 97.8 F (36.6 C) (Oral)    Resp (!) 26  Ht 6' (1.829 m)    Wt 79.4 kg    SpO2 95%    BMI 23.73 kg/m   Physical Exam Vitals and nursing note reviewed.  Constitutional:      General: He is not in acute distress.    Comments: Severely fatigued ill-appearing 70 year old male in no acute distress  HENT:     Head: Normocephalic and atraumatic.     Nose: Nose normal.     Mouth/Throat:     Mouth: Mucous membranes are dry.  Eyes:     General: No scleral icterus. Cardiovascular:     Rate and Rhythm: Normal rate and regular rhythm.     Pulses: Normal pulses.     Heart sounds:  Normal heart sounds.  Pulmonary:     Effort: Pulmonary effort is normal. No respiratory distress.     Comments: Lungs with faint crackles in bilateral bases SPO2 88% on arrival in room increased to 96% with oxygen and repositioning. Abdominal:     Palpations: Abdomen is soft.     Tenderness: There is no abdominal tenderness. There is no guarding or rebound.  Musculoskeletal:     Cervical back: Normal range of motion.     Right lower leg: No edema.     Left lower leg: No edema.     Comments: No lower extremity edema  Skin:    General: Skin is warm and dry.     Capillary Refill: Capillary refill takes less than 2 seconds.  Neurological:     Mental Status: He is alert. Mental status is at baseline.  Psychiatric:        Mood and Affect: Mood normal.        Behavior: Behavior normal.     ED Results / Procedures / Treatments   Labs (all labs ordered are listed, but only abnormal results are displayed) Labs Reviewed  SARS CORONAVIRUS 2 BY RT PCR (HOSPITAL ORDER, PERFORMED IN [facility]) - Abnormal; Notable for the following components:      Result Value   SARS Coronavirus 2 POSITIVE (*)    All other components within normal limits  CBC WITH DIFFERENTIAL/PLATELET - Abnormal; Notable for the following components:   RBC 3.37 (*)    HCT 36.3 (*)    MCV 107.7 (*)    MCH 38.6 (*)    RDW 18.7 (*)    Platelets 125 (*)    nRBC 0.9 (*)    Abs Immature Granulocytes 0.21 (*)    All other components within normal limits  COMPREHENSIVE METABOLIC PANEL - Abnormal; Notable for the following components:   Sodium 146 (*)    Glucose, Bld 149 (*)    BUN 53 (*)    Creatinine, Ser 2.64 (*)    Albumin 3.0 (*)    Alkaline Phosphatase 21 (*)    Total Bilirubin 1.6 (*)    GFR, Estimated 25 (*)    Anion gap 16 (*)    All other components within normal limits  D-DIMER, QUANTITATIVE (NOT AT [facility]) - Abnormal; Notable for the following components:   D-Dimer, Quant 1.48 (*)    All other  components within normal limits  TRIGLYCERIDES - Abnormal; Notable for the following components:   Triglycerides 269 (*)    All other components within normal limits  CULTURE, BLOOD (ROUTINE X 2)  CULTURE, BLOOD (ROUTINE X 2)  LACTIC ACID, PLASMA  PROCALCITONIN  LACTATE DEHYDROGENASE  FIBRINOGEN  LACTIC ACID, PLASMA  FERRITIN  C-REACTIVE PROTEIN  URINALYSIS,  ROUTINE W REFLEX MICROSCOPIC    EKG EKG Interpretation  Date/Time:  Thursday October 11 2020 16:17:33 EST Ventricular Rate:  72 PR Interval:    QRS Duration: 125 QT Interval:  382 QTC Calculation: 418 R Axis:   78 Text Interpretation: Sinus rhythm Nonspecific intraventricular conduction delay Borderline repolarization abnormality Baseline wander in lead(s) V5 No old tracing to compare Confirmed by Susy Frizzle [phone]) on 10/06/2020 5:05:02 PM   Radiology DG Chest Port 1 View  Result Date: 10/21/2020 CLINICAL DATA:  Shortness of breath, cough positive EXAM: PORTABLE CHEST 1 VIEW COMPARISON:  Chest x-ray 06/01/2013 FINDINGS: The heart size and mediastinal contours are unchanged. Aortic arch calcifications. Suggestion of a round well-defined 9 mm lesion overlying the right mid lung zone and ribs that is likely benign. Vague patchy airspace opacities at the left base. No focal consolidation. No pulmonary edema. No pleural effusion. No pneumothorax. No acute osseous abnormality. IMPRESSION: Vague patchy airspace opacities at the left base that could represent a combination of atelectasis and or infection/inflammation. Followup PA and lateral chest X-ray is recommended in 3-4 weeks following therapy to ensure resolution and exclude underlying malignancy. Electronically Signed   By: Tish Frederickson M.D.   On: 09/30/2020 15:55    Procedures .Critical Care Performed by: Gailen Shelter, PA Authorized by: Gailen Shelter, PA   Critical care provider statement:    Critical care time (minutes):  35   Critical care time was  exclusive of:  Separately billable procedures and treating other patients and teaching time   Critical care was necessary to treat or prevent imminent or life-threatening deterioration of the following conditions:  Respiratory failure   Critical care was time spent personally by me on the following activities:  Discussions with consultants, evaluation of patient's response to treatment, examination of patient, review of old charts, re-evaluation of patient's condition, pulse oximetry, ordering and review of radiographic studies, ordering and review of laboratory studies and ordering and performing treatments and interventions   I assumed direction of critical care for this patient from another provider in my specialty: no     (including critical care time)  Medications Ordered in ED Medications  remdesivir 200 mg in sodium chloride 0.9% 250 mL IVPB (has no administration in time range)    Followed by  remdesivir 100 mg in sodium chloride 0.9 % 100 mL IVPB (has no administration in time range)  dexamethasone (DECADRON) injection 8 mg (8 mg Intravenous Given 10/19/2020 1741)  lactated ringers bolus 500 mL (500 mLs Intravenous New Bag/Given 10/16/2020 1832)    ED Course  I have reviewed the triage vital signs and the nursing notes.  Pertinent labs & imaging results that were available during my care of the patient were reviewed by me and considered in my medical decision making (see chart for details).  Patient is 70 year old male with past medical history detailed above presented today for fatigue.  Discussed with daughter who is able to fill me in on patient steady decline since September.  However he has had rapid decline over the past 7 days has become so fatigued that he has peed his pants as well as had to use a walker for the past several days.  He is having decreased fluid intake as well as decreased food intake.  On my exam patient is severely fatigued and keeps his eyes closed for most of the  encounter although he does open them when asked to. He is symmetrically weak however.  No focal  weakness no neurologic symptoms.  Is overall nontoxic-appearing.  His levels have remained within normal limits on 2 L.  He is mildly tachypneic however.  Clinical Course as of 09/27/2020 1856  Thu Oct 11, 2020  1829 Pt admitted for COVID pneumonia hypoxia discussed with hospitalist.  [WF]    Clinical Course User Index [WF] Gailen Shelter, [place]   CBC without leukocytosis.  No significant anemia.  Lactic within normal limits.  Procalcitonin within normal limits.  Low suspicion for bacterial pneumonia.  CMP with mildly elevated sodium likely due to some mild dehydration will provide patient with 500 mL bolus over 2 hours.  For rehydration.  Also with some borderline soft blood pressures.  Mentating well.  CMP with creatinine and BUN that are elevated will likely benefit from continued fluids.  Send CMP for reference.  May be acute versus chronic renal disease/failure.  Chest x-ray reviewed by myself agree with radiology read-consistent with COVID-pneumonia. Vague patchy airspace opacities at the left base that could  represent a combination of atelectasis and or  infection/inflammation. Followup PA and lateral chest X-ray is  recommended in 3-4 weeks following therapy to ensure resolution and  exclude underlying malignancy.   EKG interpreted above.  MDM Rules/Calculators/A&P                          Menelik Dailey Buccheri was evaluated in Emergency Department on 10/15/2020 for the symptoms described in the history of present illness. He was evaluated in the context of the global COVID-19 pandemic, which necessitated consideration that the patient might be at risk for infection with the SARS-CoV-2 virus that causes COVID-19. Institutional protocols and algorithms that pertain to the evaluation of patients at risk for COVID-19 are in a state of rapid change based on information released by regulatory  bodies including the CDC and federal and state organizations. These policies and algorithms were followed during the patient's care in the ED.  Final Clinical Impression(s) / ED Diagnoses Final diagnoses:  COVID-19    Rx / DC Orders ED Discharge Orders    None       Gailen Shelter, [place] 10/14/2020 2346    Pollyann Savoy, MD 10/12/20 1014

## 2020-10-12 ENCOUNTER — Inpatient Hospital Stay (HOSPITAL_COMMUNITY): Payer: Medicare Other

## 2020-10-12 DIAGNOSIS — E119 Type 2 diabetes mellitus without complications: Secondary | ICD-10-CM | POA: Diagnosis not present

## 2020-10-12 DIAGNOSIS — U071 COVID-19: Secondary | ICD-10-CM

## 2020-10-12 DIAGNOSIS — R7989 Other specified abnormal findings of blood chemistry: Secondary | ICD-10-CM

## 2020-10-12 DIAGNOSIS — J9601 Acute respiratory failure with hypoxia: Secondary | ICD-10-CM | POA: Diagnosis not present

## 2020-10-12 DIAGNOSIS — N179 Acute kidney failure, unspecified: Secondary | ICD-10-CM | POA: Diagnosis not present

## 2020-10-12 LAB — CBC WITH DIFFERENTIAL/PLATELET
Abs Immature Granulocytes: 0.15 10*3/uL — ABNORMAL HIGH (ref 0.00–0.07)
Basophils Absolute: 0 10*3/uL (ref 0.0–0.1)
Basophils Relative: 0 %
Eosinophils Absolute: 0 10*3/uL (ref 0.0–0.5)
Eosinophils Relative: 0 %
HCT: 34 % — ABNORMAL LOW (ref 39.0–52.0)
Hemoglobin: 12 g/dL — ABNORMAL LOW (ref 13.0–17.0)
Immature Granulocytes: 2 %
Lymphocytes Relative: 17 %
Lymphs Abs: 1.1 10*3/uL (ref 0.7–4.0)
MCH: 37.4 pg — ABNORMAL HIGH (ref 26.0–34.0)
MCHC: 35.3 g/dL (ref 30.0–36.0)
MCV: 105.9 fL — ABNORMAL HIGH (ref 80.0–100.0)
Monocytes Absolute: 0.3 10*3/uL (ref 0.1–1.0)
Monocytes Relative: 4 %
Neutro Abs: 5.2 10*3/uL (ref 1.7–7.7)
Neutrophils Relative %: 77 %
Platelets: 117 10*3/uL — ABNORMAL LOW (ref 150–400)
RBC: 3.21 MIL/uL — ABNORMAL LOW (ref 4.22–5.81)
RDW: 18.7 % — ABNORMAL HIGH (ref 11.5–15.5)
WBC: 6.8 10*3/uL (ref 4.0–10.5)
nRBC: 0.7 % — ABNORMAL HIGH (ref 0.0–0.2)

## 2020-10-12 LAB — COMPREHENSIVE METABOLIC PANEL
ALT: 16 U/L (ref 0–44)
AST: 18 U/L (ref 15–41)
Albumin: 2.6 g/dL — ABNORMAL LOW (ref 3.5–5.0)
Alkaline Phosphatase: 20 U/L — ABNORMAL LOW (ref 38–126)
Anion gap: 14 (ref 5–15)
BUN: 55 mg/dL — ABNORMAL HIGH (ref 8–23)
CO2: 23 mmol/L (ref 22–32)
Calcium: 8.5 mg/dL — ABNORMAL LOW (ref 8.9–10.3)
Chloride: 109 mmol/L (ref 98–111)
Creatinine, Ser: 2.02 mg/dL — ABNORMAL HIGH (ref 0.61–1.24)
GFR, Estimated: 35 mL/min — ABNORMAL LOW (ref >60)
Glucose, Bld: 198 mg/dL — ABNORMAL HIGH (ref 70–99)
Potassium: 3.8 mmol/L (ref 3.5–5.1)
Sodium: 146 mmol/L — ABNORMAL HIGH (ref 135–145)
Total Bilirubin: 1.4 mg/dL — ABNORMAL HIGH (ref 0.3–1.2)
Total Protein: 6.1 g/dL — ABNORMAL LOW (ref 6.5–8.1)

## 2020-10-12 LAB — C-REACTIVE PROTEIN
CRP: 25.4 mg/dL — ABNORMAL HIGH (ref <1.0)
CRP: 25.1 mg/dL — ABNORMAL HIGH (ref <1.0)

## 2020-10-12 LAB — PROCALCITONIN: Procalcitonin: 0.13 ng/mL

## 2020-10-12 LAB — GLUCOSE, CAPILLARY
Glucose-Capillary: 164 mg/dL — ABNORMAL HIGH (ref 70–99)
Glucose-Capillary: 156 mg/dL — ABNORMAL HIGH (ref 70–99)
Glucose-Capillary: 202 mg/dL — ABNORMAL HIGH (ref 70–99)

## 2020-10-12 LAB — HEMOGLOBIN A1C
Hgb A1c MFr Bld: 6.4 % — ABNORMAL HIGH (ref 4.8–5.6)
Mean Plasma Glucose: 136.98 mg/dL

## 2020-10-12 LAB — FERRITIN
Ferritin: 360 ng/mL — ABNORMAL HIGH (ref 24–336)
Ferritin: 352 ng/mL — ABNORMAL HIGH (ref 24–336)

## 2020-10-12 LAB — LACTATE DEHYDROGENASE: LDH: 164 U/L (ref 98–192)

## 2020-10-12 LAB — HIV ANTIBODY (ROUTINE TESTING W REFLEX): HIV Screen 4th Generation wRfx: NONREACTIVE

## 2020-10-12 LAB — TROPONIN I (HIGH SENSITIVITY): Troponin I (High Sensitivity): 35 ng/L — ABNORMAL HIGH (ref <18)

## 2020-10-12 LAB — BRAIN NATRIURETIC PEPTIDE: B Natriuretic Peptide: 220.3 pg/mL — ABNORMAL HIGH (ref 0.0–100.0)

## 2020-10-12 LAB — D-DIMER, QUANTITATIVE: D-Dimer, Quant: 1.67 ug/mL-FEU — ABNORMAL HIGH (ref 0.00–0.50)

## 2020-10-12 MED ORDER — INSULIN ASPART 100 UNIT/ML ~~LOC~~ SOLN
0.0000 [IU] | Freq: Every day | SUBCUTANEOUS | Status: DC
  Administered 2020-10-15: 3 [IU] via SUBCUTANEOUS

## 2020-10-12 MED ORDER — GLUCERNA SHAKE PO LIQD
237.0000 mL | Freq: Three times a day (TID) | ORAL | Status: DC
  Administered 2020-10-12 – 2020-10-14 (×5): 237 mL via ORAL

## 2020-10-12 MED ORDER — INSULIN ASPART 100 UNIT/ML ~~LOC~~ SOLN
0.0000 [IU] | Freq: Three times a day (TID) | SUBCUTANEOUS | Status: DC
  Administered 2020-10-12: 3 [IU] via SUBCUTANEOUS
  Administered 2020-10-12 – 2020-10-14 (×7): 2 [IU] via SUBCUTANEOUS
  Administered 2020-10-15: 5 [IU] via SUBCUTANEOUS
  Administered 2020-10-15: 3 [IU] via SUBCUTANEOUS
  Administered 2020-10-15 – 2020-10-16 (×2): 5 [IU] via SUBCUTANEOUS

## 2020-10-12 MED ORDER — METHYLPREDNISOLONE SODIUM SUCC 40 MG IJ SOLR
40.0000 mg | Freq: Two times a day (BID) | INTRAMUSCULAR | Status: DC
  Administered 2020-10-12 – 2020-10-13 (×2): 40 mg via INTRAVENOUS
  Filled 2020-10-12 (×2): qty 1

## 2020-10-12 MED ORDER — ENOXAPARIN SODIUM 40 MG/0.4ML ~~LOC~~ SOLN
40.0000 mg | SUBCUTANEOUS | Status: DC
  Administered 2020-10-12 – 2020-10-14 (×3): 40 mg via SUBCUTANEOUS
  Filled 2020-10-12 (×3): qty 0.4

## 2020-10-12 MED ORDER — SODIUM CHLORIDE 0.45 % IV SOLN
INTRAVENOUS | Status: DC
  Administered 2020-10-12: 1000 mL via INTRAVENOUS

## 2020-10-12 NOTE — Evaluation (Signed)
Physical Therapy Evaluation Patient Details Name: Joseph Crawford MRN: [id number] DOB: 06-20-1951 Today's Date: 10/12/2020   History of Present Illness  70 y.o. 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 Pt found COVID + Pulse ox 86% on RA. Chest x-ray showed vague patchy airspace opacities in the left base. Admitted 09/25/2020 for treatment of COVID.  Clinical Impression  PTA pt living in level entry apartment with his daughter. Pt reports independence with RW, he does his own bird baths and dressing, and does cooking and cleaning in home, assists daughter with grocery shopping. Pt is limited in safe mobility by decrease awareness of situation, in presence of increased oxygen demand, and decreased strength and balance. Pt is min A for bed mobility, and for transfer to and from [facility]. PT recommending HHPT at discharge to improve strength, balance and endurance. PT will continue to follow acutely.     Follow Up Recommendations Home health PT;Supervision for mobility/OOB    Equipment Recommendations  Rolling walker with 5" wheels       Precautions / Restrictions Precautions Precautions: None Restrictions Weight Bearing Restrictions: No      Mobility  Bed Mobility Overal bed mobility: Needs Assistance Bed Mobility: Supine to Sit;Sit to Supine;Rolling Rolling: Min assist   Supine to sit: Min assist Sit to supine: Min assist   General bed mobility comments: min A for pulling up against therapist to get up to side of bed, pt reports he can get back to bed but is essentially falls backward into bed and he couldn't get his LE back into bed, needs assist to come to upright, and bring LE back into bed     Transfers Overall transfer level: Needs assistance Equipment used: 1 person hand held assist Transfers: Stand Pivot Transfers;Sit to/from Stand Sit to Stand: Min assist Stand pivot transfers: Min assist       General transfer comment: min A for steadying with coming to standing and pivoting to and from [facility], pt needs max A for pericare         Balance Overall balance assessment: Needs assistance Sitting-balance support: No upper extremity supported;Feet supported Sitting balance-Leahy Scale: Fair     Standing balance support: Single extremity supported;Bilateral upper extremity supported Standing balance-Leahy Scale: Poor                               Pertinent Vitals/Pain Pain Assessment: No/denies pain    Home Living Family/patient expects to be discharged to:: Private residence Living Arrangements: Children Available Help at Discharge: Family;Available PRN/intermittently Type of Home: Apartment Home Access: Level entry     Home Layout: One level Home Equipment: Walker - 2 wheels      Prior Function Level of Independence: Independent with assistive device(s)         Comments: ambulates with RW, independent with bird baths, and dressing, takes care of the cooking and cleaning, goes with daughter shopping     Hand Dominance        Extremity/Trunk Assessment   Upper Extremity Assessment Upper Extremity Assessment: Overall WFL for tasks assessed;Generalized weakness    Lower Extremity Assessment Lower Extremity Assessment: Overall WFL for tasks assessed;Generalized weakness  Communication   Communication: No difficulties  Cognition Arousal/Alertness: Awake/alert Behavior During Therapy: WFL for tasks assessed/performed Overall Cognitive Status: Impaired/Different from baseline Area of Impairment: Following commands;Awareness;Problem solving                       Following Commands: Follows multi-step commands with  increased time;Follows multi-step commands inconsistently   Awareness: Emergent Problem Solving: Slow processing;Requires verbal cues;Requires tactile cues General Comments: pt requests to get up to use bathroom, unaware he has been incontinent of stool, pt with difficulty sequencing return to bed and scooting up in bed      General Comments General comments (skin integrity, edema, etc.): SaO2 on 2L O2 91%O2, HR 66bpm, BP 116/91        Assessment/Plan    PT Assessment Patient needs continued PT services  PT Problem List Decreased strength;Decreased activity tolerance;Decreased balance;Decreased mobility;Decreased cognition;Decreased safety awareness;Cardiopulmonary status limiting activity       PT Treatment Interventions DME instruction;Gait training;Functional mobility training;Therapeutic activities;Therapeutic exercise;Balance training;Cognitive remediation;Patient/family education    PT Goals (Current goals can be found in the Care Plan section)  Acute Rehab PT Goals Patient Stated Goal: get back home PT Goal Formulation: With patient Time For Goal Achievement: 10/26/20 Potential to Achieve Goals: Fair    Frequency Min 3X/week    AM-PAC PT "6 Clicks" Mobility  Outcome Measure Help needed turning from your back to your side while in a flat bed without using bedrails?: None Help needed moving from lying on your back to sitting on the side of a flat bed without using bedrails?: A Little Help needed moving to and from a bed to a chair (including a wheelchair)?: A Little Help needed standing up from a chair using your arms (e.g., wheelchair or bedside chair)?: A Little Help needed to walk in hospital room?: A Little Help needed climbing 3-5 steps with a railing? : A Lot 6 Click Score: 18    End of Session Equipment Utilized During Treatment: Oxygen Activity Tolerance: Patient limited by fatigue Patient left: in bed;with call bell/phone within reach;with bed alarm  set Nurse Communication: Mobility status PT Visit Diagnosis: Unsteadiness on feet (R26.81);Other abnormalities of gait and mobility (R26.89);Muscle weakness (generalized) (M62.81);Difficulty in walking, not elsewhere classified (R26.2)    Time: 1539-1610 PT Time Calculation (min) (ACUTE ONLY): 31 min   Charges:   PT Evaluation $PT Eval Moderate Complexity: 1 Mod PT Treatments $Therapeutic Activity: 8-22 mins        Belissa Kooy B. Beverely Risen PT, DPT Acute Rehabilitation Services Pager [phone] Office [phone]   Elon Alas [facility] 10/12/2020, 4:47 PM

## 2020-10-12 NOTE — Plan of Care (Signed)

## 2020-10-12 NOTE — Progress Notes (Signed)
Lower extremity venous has been completed.   Preliminary results in CV Proc.   Blanch Media 10/12/2020 2:30 PM

## 2020-10-12 NOTE — Progress Notes (Signed)
PROGRESS NOTE  Joseph Crawford PPJ:[id number] DOB: [birth year] DOA: 10/15/2020  PCP: Hurshel Party, NP  Brief History/Interval Summary: 70 y.o. 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  The patient has not been vaccinated against COVID-19.  He tested positive for COVID-19.  Chest x-ray showed patchy airspace disease.  Patient was hospitalized for further management.  Reason for Visit: Pneumonia due to COVID-19.  Acute respiratory failure with hypoxia  Consultants: None  Procedures: None  Antibiotics: Anti-infectives (From admission, onward)   Start     Dose/Rate Route Frequency Ordered Stop   10/12/20 1000  remdesivir 100 mg in sodium chloride 0.9 % 100 mL IVPB       "Followed by" Linked Group Details   100 mg 200 mL/hr over 30 Minutes Intravenous Daily 10/01/2020 1636 10/16/20 0959   10/05/2020 1900  remdesivir 200 mg in sodium chloride 0.9% 250 mL IVPB       "Followed by" Linked Group Details   200 mg 580 mL/hr over 30 Minutes Intravenous Once 10/09/2020 1636 10/19/2020 2028      Subjective/Interval History: Patient noted to be lethargic but states that he is feeling better than yesterday.  Continues to have shortness of breath with exertion.  Continues to have a dry cough.  Denies any nausea vomiting.    Assessment/Plan:  Acute Hypoxic Resp. Failure/Pneumonia due to COVID-19  Recent Labs  Lab 10/19/2020 1612 10/21/2020 2246 10/12/20 0232  DDIMER 1.48* 1.60* 1.67*  FERRITIN 343* 360* 352*  CRP 22.7* 25.4* 25.1*  ALT 18  --  16  PROCALCITON 0.20 0.13  --     Objective findings: Fever: Remains afebrile Oxygen requirements: On nasal cannula 2 L saturating in the early 90s.  COVID 19  Therapeutics: Antibacterials: Procalcitonin was 0.13.  Not on any antibacterials currently. Remdesivir: Day 2 Steroids: Currently on Solu-Medrol Diuretics: None Inhaled Steroids: None Actemra/Baricitinib: No clear indication as yet PUD Prophylaxis: Noted to be on famotidine DVT Prophylaxis:  Lovenox   Patient tested positive on 1/20.  Respiratory standpoint patient seems to be stable.  He does not have high oxygen requirements.  D-dimer noted to be only minimally elevated.  Inflammatory markers noted to be significantly elevated.  Continue steroids Remdesivir.  No clear indication for Actemra or baricitinib at this time.  Incentive spirometry mobilization.  Continue to trend inflammatory markers.  The treatment plan and use of medications and known side effects were discussed with patient/family. Some of the medications used are based on case reports/anecdotal data.  All other medications being used in the management of COVID-19 based on limited study data.  Complete risks and long-term side effects are unknown, however in the best clinical judgment they seem to be of some benefit.  Patient/family wanted to proceed with treatment options provided.  Acute kidney injury/hypernatremia No recent labs available.  The last creatinine we have in the EMR is from 2015 through [facility] when creatinine was 1.2.  Creatinine was noted to be 2.6 at admission with a BUN of 53.  Thought to be acute kidney injury.  Patient being gently hydrated.  Labs have improved this morning.  Monitor urine output.  Continue IV fluids for now.  Diabetes mellitus type 2 Holding metformin.  Monitor CBGs.  HbA1c 6.4.  Mild thrombocytopenia Likely due to acute illness.  No evidence of bleeding.  Continue to trend.  Essential hypertension Monitor blood pressures closely.  Noted to be on metoprolol.  Hyzaar on hold.  GERD Continue famotidine   DVT Prophylaxis: Lovenox Code Status: Full code Family Communication:  We will update family member later today Disposition Plan: Hopefully return home when improved  Status is: Inpatient  Remains inpatient appropriate because:IV treatments appropriate due to intensity of illness or inability to take PO and Inpatient level of care appropriate due to severity of illness   Dispo: The patient is from: Home              Anticipated d/c is to: Home              Anticipated d/c date is: 3 days              Patient currently is not medically stable to d/c.     Medications:  Scheduled: . vitamin C  500 mg Oral Daily  . aspirin  325 mg Oral Daily  . cholecalciferol  1,000 Units Oral Daily  . enoxaparin (LOVENOX) injection  30 mg Subcutaneous Q24H  . famotidine  20 mg Oral BID  . fenofibrate  160 mg Oral Daily  . guaiFENesin  600 mg Oral BID  . insulin aspart  0-5 Units Subcutaneous QHS  . insulin aspart  0-9 Units Subcutaneous TID WC  . methylPREDNISolone (SOLU-MEDROL) injection  1 mg/kg Intravenous Q12H   Followed by  . [START ON 10/14/2020] predniSONE  50 mg Oral Daily  . metoprolol succinate  25 mg Oral BID  . omega-3 acid ethyl esters  1 g Oral BID  . pantoprazole  20 mg Oral Daily  . zinc sulfate  220 mg Oral Daily   Continuous: . sodium chloride 100 mL/hr at 09/22/2020 2027  . remdesivir 100 mg in NS 100 mL 100 mg (10/12/20 0920)   ZOX:[id number]:chlorpheniramine-HYDROcodone, guaiFENesin-dextromethorphan, labetalol, ondansetron **OR** ondansetron (ZOFRAN) IV, polyethylene glycol, traZODone   Objective:  Vital Signs  Vitals:   10/02/2020 2355 10/12/20 0415 10/12/20 0846 10/12/20 0908  BP: 120/72 128/72 113/62 113/62  Pulse: 70 74 66 72  Resp: (!) 25 (!) 25 20   Temp: 98.1 F (36.7 C) 98.7 F (37.1 C) 97.7 F (36.5 C)   TempSrc: Oral Oral Oral   SpO2: 96% 97% 94%   Weight:      Height:        Intake/Output Summary (Last 24 hours) at 10/12/2020 1130 Last data filed at 10/12/2020 0600 Gross per 24 hour  Intake 960 ml  Output --  Net 960 ml    Filed Weights   10/04/2020 1625  Weight: 79.4 kg    General appearance: Awake alert.  In no distress.  Lethargic Resp: Normal effort at rest.  Few crackles bilateral bases.  No wheezing or rhonchi. Cardio: S1-S2 is normal regular.  No S3-S4.  No rubs murmurs or bruit GI: Abdomen is soft.  Nontender nondistended.  Bowel sounds are present normal.  No masses organomegaly Extremities: No edema.  Full range of motion of lower extremities. Neurologic:   No focal neurological deficits.    Lab Results:  Data Reviewed: I have personally reviewed following labs and imaging studies  CBC: Recent Labs  Lab 10/20/2020 1612 10/12/20 0232  WBC 7.6 6.8  NEUTROABS 5.3 5.2  HGB 13.0 12.0*  HCT 36.3* 34.0*  MCV 107.7* 105.9*  PLT 125* 117*  Basic Metabolic Panel: Recent Labs  Lab 10/07/2020 1612 10/12/20 0232  NA 146* 146*  K 3.6 3.8  CL 107 109  CO2 23 23  GLUCOSE 149* 198*  BUN 53* 55*  CREATININE 2.64* 2.02*  CALCIUM 8.9 8.5*    GFR: Estimated Creatinine Clearance: 37.3 mL/min (A) (by C-G formula based on SCr of 2.02 mg/dL (H)).  Liver Function Tests: Recent Labs  Lab 09/25/2020 1612 10/12/20 0232  AST 23 18  ALT 18 16  ALKPHOS 21* 20*  BILITOT 1.6* 1.4*  PROT 6.7 6.1*  ALBUMIN 3.0* 2.6*     HbA1C: Recent Labs    10/12/20 0232  HGBA1C 6.4*    CBG: Recent Labs  Lab 10/12/20 0844  GLUCAP 164*    Lipid Profile: Recent Labs    10/04/2020 1532  TRIG 269*     Anemia Panel: Recent Labs    10/19/2020 2246 10/12/20 0232  FERRITIN 360* 352*    Recent Results (from the past 240 hour(s))  SARS Coronavirus 2 by RT PCR (hospital order, performed in [facility] hospital lab) Nasopharyngeal Nasopharyngeal Swab     Status: Abnormal   Collection Time: 10/12/2020  2:03 PM   Specimen: Nasopharyngeal Swab  Result Value Ref Range Status   SARS Coronavirus 2 POSITIVE (A) NEGATIVE Final    Comment: emailed L. Berdik RN 15:10 09/26/2020 ([email or web address]) (NOTE) SARS-CoV-2  target nucleic acids are DETECTED  SARS-CoV-2 RNA is generally detectable in upper respiratory specimens  during the acute phase of infection.  Positive results are indicative  of the presence of the identified virus, but do not rule out bacterial infection or co-infection with other pathogens not detected by the test.  Clinical correlation with patient history and  other diagnostic information is necessary to determine patient infection status.  The expected result is negative.  Fact Sheet for Patients:   BoilerBrush.com.cy   Fact Sheet for Healthcare Providers:   https://pope.com/    This test is not yet approved or cleared by the [place] FDA and  has been authorized for detection and/or diagnosis of SARS-CoV-2 by FDA under an Emergency Use Authorization (EUA).  This EUA will remain in effect (meaning this test can be used) for the duration of  the  COVID-19 declaration under Section 564(b)(1) of the Act, 21 U.S.C. section 360-bbb-3(b)(1), unless the authorization is terminated or revoked sooner.  Performed at [facility], [address], [place], [place] [address]   Blood Culture (routine x 2)     Status: None (Preliminary result)   Collection Time: 09/27/2020  4:12 PM   Specimen: BLOOD  Result Value Ref Range Status   Specimen Description BLOOD LEFT ANTECUBITAL  Final   Special Requests   Final    BOTTLES DRAWN AEROBIC AND ANAEROBIC Blood Culture results may not be optimal due to an excessive volume of blood received in culture bottles   Culture   Final    NO GROWTH < 24 HOURS Performed at [facility], [address] [place], [place] [address]    Report Status PENDING  Incomplete  Blood Culture (routine x 2)     Status: None (Preliminary result)   Collection Time: 10/09/2020  4:15 PM   Specimen: BLOOD LEFT ARM  Result Value Ref Range Status   Specimen Description BLOOD LEFT ARM  Final   Special Requests    Final    BOTTLES DRAWN AEROBIC AND ANAEROBIC Blood Culture adequate volume   Culture   Final  NO GROWTH < 24 HOURS Performed at [facility], [address] [place], [place] [address]    Report Status PENDING  Incomplete      Radiology Studies: DG Chest Port 1 View  Result Date: 10/21/2020 CLINICAL DATA:  Shortness of breath, cough positive EXAM: PORTABLE CHEST 1 VIEW COMPARISON:  Chest x-ray 06/01/2013 FINDINGS: The heart size and mediastinal contours are unchanged. Aortic arch calcifications. Suggestion of a round well-defined 9 mm lesion overlying the right mid lung zone and ribs that is likely benign. Vague patchy airspace opacities at the left base. No focal consolidation. No pulmonary edema. No pleural effusion. No pneumothorax. No acute osseous abnormality. IMPRESSION: Vague patchy airspace opacities at the left base that could represent a combination of atelectasis and or infection/inflammation. Followup PA and lateral chest X-ray is recommended in 3-4 weeks following therapy to ensure resolution and exclude underlying malignancy. Electronically Signed   By: Tish Frederickson M.D.   On: 10/20/2020 15:55       LOS: 1 day   Johncarlos Holtsclaw  [facility] Pager on [email or web address]  10/12/2020, 11:30 AM

## 2020-10-13 ENCOUNTER — Inpatient Hospital Stay (HOSPITAL_COMMUNITY): Payer: Medicare Other

## 2020-10-13 DIAGNOSIS — R410 Disorientation, unspecified: Secondary | ICD-10-CM

## 2020-10-13 DIAGNOSIS — U071 COVID-19: Secondary | ICD-10-CM | POA: Diagnosis not present

## 2020-10-13 DIAGNOSIS — N179 Acute kidney failure, unspecified: Secondary | ICD-10-CM | POA: Diagnosis not present

## 2020-10-13 DIAGNOSIS — E119 Type 2 diabetes mellitus without complications: Secondary | ICD-10-CM | POA: Diagnosis not present

## 2020-10-13 LAB — URINALYSIS, ROUTINE W REFLEX MICROSCOPIC
Bacteria, UA: NONE SEEN
Bilirubin Urine: NEGATIVE
Glucose, UA: NEGATIVE mg/dL
Ketones, ur: 5 mg/dL — AB
Leukocytes,Ua: NEGATIVE
Nitrite: NEGATIVE
Protein, ur: NEGATIVE mg/dL
Specific Gravity, Urine: 1.024 (ref 1.005–1.030)
pH: 5 (ref 5.0–8.0)

## 2020-10-13 LAB — CBC WITH DIFFERENTIAL/PLATELET
Abs Immature Granulocytes: 0.46 10*3/uL — ABNORMAL HIGH (ref 0.00–0.07)
Basophils Absolute: 0 10*3/uL (ref 0.0–0.1)
Basophils Relative: 0 %
Eosinophils Absolute: 0 10*3/uL (ref 0.0–0.5)
Eosinophils Relative: 0 %
HCT: 38.7 % — ABNORMAL LOW (ref 39.0–52.0)
Hemoglobin: 13.1 g/dL (ref 13.0–17.0)
Immature Granulocytes: 6 %
Lymphocytes Relative: 13 %
Lymphs Abs: 1.1 10*3/uL (ref 0.7–4.0)
MCH: 35.9 pg — ABNORMAL HIGH (ref 26.0–34.0)
MCHC: 33.9 g/dL (ref 30.0–36.0)
MCV: 106 fL — ABNORMAL HIGH (ref 80.0–100.0)
Monocytes Absolute: 0.5 10*3/uL (ref 0.1–1.0)
Monocytes Relative: 6 %
Neutro Abs: 6.1 10*3/uL (ref 1.7–7.7)
Neutrophils Relative %: 75 %
Platelets: 141 10*3/uL — ABNORMAL LOW (ref 150–400)
RBC: 3.65 MIL/uL — ABNORMAL LOW (ref 4.22–5.81)
RDW: 18.2 % — ABNORMAL HIGH (ref 11.5–15.5)
WBC: 8.2 10*3/uL (ref 4.0–10.5)
nRBC: 1.2 % — ABNORMAL HIGH (ref 0.0–0.2)

## 2020-10-13 LAB — COMPREHENSIVE METABOLIC PANEL
ALT: 17 U/L (ref 0–44)
AST: 19 U/L (ref 15–41)
Albumin: 2.6 g/dL — ABNORMAL LOW (ref 3.5–5.0)
Alkaline Phosphatase: 20 U/L — ABNORMAL LOW (ref 38–126)
Anion gap: 16 — ABNORMAL HIGH (ref 5–15)
BUN: 49 mg/dL — ABNORMAL HIGH (ref 8–23)
CO2: 25 mmol/L (ref 22–32)
Calcium: 8.6 mg/dL — ABNORMAL LOW (ref 8.9–10.3)
Chloride: 109 mmol/L (ref 98–111)
Creatinine, Ser: 1.45 mg/dL — ABNORMAL HIGH (ref 0.61–1.24)
GFR, Estimated: 52 mL/min — ABNORMAL LOW (ref >60)
Glucose, Bld: 167 mg/dL — ABNORMAL HIGH (ref 70–99)
Potassium: 3.6 mmol/L (ref 3.5–5.1)
Sodium: 150 mmol/L — ABNORMAL HIGH (ref 135–145)
Total Bilirubin: 1.4 mg/dL — ABNORMAL HIGH (ref 0.3–1.2)
Total Protein: 6.3 g/dL — ABNORMAL LOW (ref 6.5–8.1)

## 2020-10-13 LAB — GLUCOSE, CAPILLARY
Glucose-Capillary: 157 mg/dL — ABNORMAL HIGH (ref 70–99)
Glucose-Capillary: 175 mg/dL — ABNORMAL HIGH (ref 70–99)
Glucose-Capillary: 152 mg/dL — ABNORMAL HIGH (ref 70–99)

## 2020-10-13 LAB — D-DIMER, QUANTITATIVE: D-Dimer, Quant: 1.28 ug/mL-FEU — ABNORMAL HIGH (ref 0.00–0.50)

## 2020-10-13 LAB — C-REACTIVE PROTEIN: CRP: 17.8 mg/dL — ABNORMAL HIGH (ref <1.0)

## 2020-10-13 MED ORDER — DEXTROSE 5 % IV SOLN: INTRAVENOUS | Status: AC

## 2020-10-13 MED ORDER — METHYLPREDNISOLONE SODIUM SUCC 40 MG IJ SOLR
40.0000 mg | Freq: Every day | INTRAMUSCULAR | Status: DC
  Administered 2020-10-14 – 2020-10-16 (×3): 40 mg via INTRAVENOUS
  Filled 2020-10-13 (×3): qty 1

## 2020-10-13 MED ORDER — HALOPERIDOL LACTATE 5 MG/ML IJ SOLN
1.0000 mg | Freq: Four times a day (QID) | INTRAMUSCULAR | Status: DC | PRN
  Administered 2020-10-13 – 2020-10-14 (×3): 1 mg via INTRAMUSCULAR
  Filled 2020-10-13 (×3): qty 1

## 2020-10-13 NOTE — Progress Notes (Signed)
Called telesitter [phone] spoke to [place].

## 2020-10-13 NOTE — Progress Notes (Signed)
Pt very irritable. Jerked away when inserting PIV. Refuses anyone to restick. Will notify primaryt RN

## 2020-10-13 NOTE — Progress Notes (Signed)
Called pts daughter Dirck Butch [phone] [facility] for her to return my call. Awaiting call back.

## 2020-10-13 NOTE — Progress Notes (Signed)
PROGRESS NOTE  Joseph Crawford JHE:[id number] DOB: 28-Mar-1951 DOA: 2020-10-23  PCP: Hurshel Party, NP  Brief History/Interval Summary: 70 y.o. 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  The patient has not been vaccinated against COVID-19.  He tested positive for COVID-19.  Chest x-ray showed patchy airspace disease.  Patient was hospitalized for further management.  Reason for Visit: Pneumonia due to COVID-19.  Acute respiratory failure with hypoxia  Consultants: None  Procedures: None  Antibiotics: Anti-infectives (From admission, onward)   Start     Dose/Rate Route Frequency Ordered Stop   10/12/20 1000  remdesivir 100 mg in sodium chloride 0.9 % 100 mL IVPB       "Followed by" Linked Group Details   100 mg 200 mL/hr over 30 Minutes Intravenous Daily 23-Oct-2020 1636 10/16/20 0959   10/23/2020 1900  remdesivir 200 mg in sodium chloride 0.9% 250 mL IVPB       "Followed by" Linked Group Details   200 mg 580 mL/hr over 30 Minutes Intravenous Once 2020-10-23 1636 October 23, 2020 2028      Subjective/Interval History: Patient noted to be confused this morning. Per nursing staff he has been pulling his IV out. Denies any shortness of breath or chest pain.    Assessment/Plan:  Acute Hypoxic Resp. Failure/Pneumonia due to COVID-19  Recent Labs  Lab 10-23-20 1612 10/23/20 2246 10/12/20 0232 10/13/20 0251  DDIMER 1.48* 1.60* 1.67* 1.28*  FERRITIN 343* 360* 352*  --   CRP 22.7* 25.4* 25.1* 17.8*  ALT 18  --  16 17  PROCALCITON 0.20 0.13  --   --     Objective findings: Fever: Remains afebrile Oxygen requirements: Noted to be on room air saturating in the mid 90s   COVID 19 Therapeutics: Antibacterials:  Procalcitonin was 0.13.  Not on any antibacterials currently. Remdesivir: Day 3 Steroids:  Solu-Medrol Diuretics: None Inhaled Steroids: None Actemra/Baricitinib: No clear indication as yet PUD Prophylaxis: Noted to be on famotidine DVT Prophylaxis:  Lovenox   Patient tested positive on 1/20.  From a respiratory standpoint patient seems to be improving. Oxygen requirements have come down. CRP is improving. D-dimer stable. Lower extremity Doppler studies did not show any DVT. Continue Remdesivir and steroids. No clear indication for Actemra or baricitinib at this time. Continue with incentive spirometry and mobilization.  The treatment plan and use of medications and known side effects were discussed with patient/family. Some of the medications used are based on case reports/anecdotal data.  All other medications being used in the management of COVID-19 based on limited study data.  Complete risks and long-term side effects are unknown, however in the best clinical judgment they seem to be of some benefit.  Patient/family wanted to proceed with treatment options provided.  Acute kidney injury/hypernatremia No recent labs available.  The last creatinine we have in the EMR is from 2015 through [facility] when creatinine was 1.2.  Creatinine was noted to be 2.6 at admission with a BUN of 53.  Thought to be acute kidney injury.   Patient was gently hydrated. Renal function has improved. Sodium level remains elevated. Changed his IV fluids to D5 infusion for now. Recheck labs tomorrow.   Hospital-acquired delirium Patient noted to be more confused today. No  focal deficits noted. Use Haldol. May need to use mittens and soft restraints as he keeps pulling his IV out. No reports of falls or injuries.  Diabetes mellitus type 2 Holding metformin.  Monitor CBGs.  HbA1c 6.4.  Mild thrombocytopenia Likely due to acute illness.  No evidence of bleeding. Seems to be improving.  Essential  hypertension Monitor blood pressures closely.  Noted to be on metoprolol.  Hyzaar on hold.  GERD Continue famotidine   DVT Prophylaxis: Lovenox Code Status: Full code Family Communication: Daughter was updated yesterday. We will do so again today. Disposition Plan: Hopefully return home when improved  Status is: Inpatient  Remains inpatient appropriate because:IV treatments appropriate due to intensity of illness or inability to take PO and Inpatient level of care appropriate due to severity of illness   Dispo: The patient is from: Home              Anticipated d/c is to: Home              Anticipated d/c date is: 3 days              Patient currently is not medically stable to d/c.     Medications:  Scheduled: . vitamin C  500 mg Oral Daily  . aspirin  325 mg Oral Daily  . cholecalciferol  1,000 Units Oral Daily  . enoxaparin (LOVENOX) injection  40 mg Subcutaneous Q24H  . famotidine  20 mg Oral BID  . feeding supplement (GLUCERNA SHAKE)  237 mL Oral TID BM  . fenofibrate  160 mg Oral Daily  . guaiFENesin  600 mg Oral BID  . insulin aspart  0-5 Units Subcutaneous QHS  . insulin aspart  0-9 Units Subcutaneous TID WC  . methylPREDNISolone (SOLU-MEDROL) injection  40 mg Intravenous Q12H  . metoprolol succinate  25 mg Oral BID  . omega-3 acid ethyl esters  1 g Oral BID  . pantoprazole  20 mg Oral Daily  . zinc sulfate  220 mg Oral Daily   Continuous: . dextrose 50 mL/hr at 10/13/20 0812  . remdesivir 100 mg in NS 100 mL 100 mg (10/13/20 0849)   XBM:[id number]:chlorpheniramine-HYDROcodone, guaiFENesin-dextromethorphan, haloperidol lactate, labetalol, ondansetron **OR** ondansetron (ZOFRAN) IV, polyethylene glycol, traZODone   Objective:  Vital Signs  Vitals:   10/12/20 1950 10/13/20 0000 10/13/20 0457 10/13/20 0820  BP: 130/74 106/69 (!) 156/88 120/83  Pulse: 73  86 82  Resp: 20 20 20  (!) 28  Temp: 97.7 F (36.5 C) 97.8 F (36.6 C) 97.8 F (36.6 C) 97.9 F (36.6 C)   TempSrc: Oral Oral Oral Oral  SpO2:   100% 99%  Weight:   75.1 kg   Height:        Intake/Output Summary (Last 24 hours) at 10/13/2020 1227 Last data filed at 10/13/2020 0849 Gross per 24 hour  Intake 1350 ml  Output 3050 ml  Net -1700 ml   Filed Weights   24-Mar-2021 1625 10/13/20 0457  Weight: 79.4 kg 75.1 kg    General appearance: Awake alert.  In no distress. Distracted Resp: Normal effort. Coarse breath sounds bilaterally. Few crackles in the bases. No wheezing or rhonchi. Cardio: S1-S2 is normal regular.  No S3-S4.  No rubs murmurs or bruit GI: Abdomen is soft.  Nontender nondistended.  Bowel sounds are present normal.  No masses organomegaly Extremities: No edema.  Neurologic: Mildly confused and distracted today. No focal deficits.   Lab Results:  Data Reviewed: I have personally reviewed following  labs and imaging studies  CBC: Recent Labs  Lab 09/26/2020 1612 10/12/20 0232 10/13/20 0251  WBC 7.6 6.8 8.2  NEUTROABS 5.3 5.2 6.1  HGB 13.0 12.0* 13.1  HCT 36.3* 34.0* 38.7*  MCV 107.7* 105.9* 106.0*  PLT 125* 117* 141*    Basic Metabolic Panel: Recent Labs  Lab 10/04/2020 1612 10/12/20 0232 10/13/20 0251  NA 146* 146* 150*  K 3.6 3.8 3.6  CL 107 109 109  CO2 23 23 25   GLUCOSE 149* 198* 167*  BUN 53* 55* 49*  CREATININE 2.64* 2.02* 1.45*  CALCIUM 8.9 8.5* 8.6*    GFR: Estimated Creatinine Clearance: 50.4 mL/min (A) (by C-G formula based on SCr of 1.45 mg/dL (H)).  Liver Function Tests: Recent Labs  Lab 10/10/2020 1612 10/12/20 0232 10/13/20 0251  AST 23 18 19   ALT 18 16 17   ALKPHOS 21* 20* 20*  BILITOT 1.6* 1.4* 1.4*  PROT 6.7 6.1* 6.3*  ALBUMIN 3.0* 2.6* 2.6*     HbA1C: Recent Labs    10/12/20 0232  HGBA1C 6.4*    CBG: Recent Labs  Lab 10/12/20 0844 10/12/20 1158 10/12/20 1623  GLUCAP 164* 156* 202*    Lipid Profile: Recent Labs    09/30/2020 1532  TRIG 269*     Anemia Panel: Recent Labs    09/24/2020 2246 10/12/20 0232   FERRITIN 360* 352*    Recent Results (from the past 240 hour(s))  SARS Coronavirus 2 by RT PCR (hospital order, performed in [facility] hospital lab) Nasopharyngeal Nasopharyngeal Swab     Status: Abnormal   Collection Time: 10/07/2020  2:03 PM   Specimen: Nasopharyngeal Swab  Result Value Ref Range Status   SARS Coronavirus 2 POSITIVE (A) NEGATIVE Final    Comment: emailed L. Berdik RN 15:10 10/14/2020 ([email or web address]) (NOTE) SARS-CoV-2 target nucleic acids are DETECTED  SARS-CoV-2 RNA is generally detectable in upper respiratory specimens  during the acute phase of infection.  Positive results are indicative  of the presence of the identified virus, but do not rule out bacterial infection or co-infection with other pathogens not detected by the test.  Clinical correlation with patient history and  other diagnostic information is necessary to determine patient infection status.  The expected result is negative.  Fact Sheet for Patients:   [facility]   Fact Sheet for Healthcare Providers:   10/13/20    This test is not yet approved or cleared by the 10/13/20 FDA and  has been authorized for detection and/or diagnosis of SARS-CoV-2 by FDA under an Emergency Use Authorization (EUA).  This EUA will remain in effect (meaning this test can be used) for the duration of  the  COVID-19 declaration under Section 564(b)(1) of the Act, 21 U.S.C. section 360-bbb-3(b)(1), unless the authorization is terminated or revoked sooner.  Performed at [facility], [address], [place], [facility] [address]   Blood Culture (routine x 2)     Status: None (Preliminary result)   Collection Time: 09/27/2020  4:12 PM   Specimen: BLOOD  Result Value Ref Range Status   Specimen Description BLOOD LEFT ANTECUBITAL  Final   Special Requests   Final    BOTTLES DRAWN AEROBIC AND ANAEROBIC Blood Culture results may not be optimal due to an  excessive volume of blood received in culture bottles   Culture   Final    NO GROWTH < 24 HOURS Performed at [facility], [address] [place], [facility] [address]    Report  Status PENDING  Incomplete  Blood Culture (routine x 2)     Status: None (Preliminary result)   Collection Time: 2020/11/04  4:15 PM   Specimen: BLOOD LEFT ARM  Result Value Ref Range Status   Specimen Description BLOOD LEFT ARM  Final   Special Requests   Final    BOTTLES DRAWN AEROBIC AND ANAEROBIC Blood Culture adequate volume   Culture   Final    NO GROWTH < 24 HOURS Performed at [facility], [address], [place], [place] [address]    Report Status PENDING  Incomplete      Radiology Studies: DG Chest Port 1 View  Result Date: 11/04/20 CLINICAL DATA:  Shortness of breath, cough positive EXAM: PORTABLE CHEST 1 VIEW COMPARISON:  Chest x-ray 06/01/2013 FINDINGS: The heart size and mediastinal contours are unchanged. Aortic arch calcifications. Suggestion of a round well-defined 9 mm lesion overlying the right mid lung zone and ribs that is likely benign. Vague patchy airspace opacities at the left base. No focal consolidation. No pulmonary edema. No pleural effusion. No pneumothorax. No acute osseous abnormality. IMPRESSION: Vague patchy airspace opacities at the left base that could represent a combination of atelectasis and or infection/inflammation. Followup PA and lateral chest X-ray is recommended in 3-4 weeks following therapy to ensure resolution and exclude underlying malignancy. Electronically Signed   By: Tish Frederickson M.D.   On: 04-Nov-2020 15:55   VAS [place] LOWER EXTREMITY VENOUS (DVT)  Result Date: 10/12/2020  Lower Venous DVT Study Indications: Covid, high d dimer.  Comparison Study: no prior Performing Technologist: Blanch Media RVS  Examination Guidelines: A complete evaluation includes B-mode imaging, spectral Doppler, color Doppler, and power Doppler as needed of all accessible  portions of each vessel. Bilateral testing is considered an integral part of a complete examination. Limited examinations for reoccurring indications may be performed as noted. The reflux portion of the exam is performed with the patient in reverse Trendelenburg.  +---------+---------------+---------+-----------+----------+--------------+ RIGHT    CompressibilityPhasicitySpontaneityPropertiesThrombus Aging +---------+---------------+---------+-----------+----------+--------------+ CFV      Full           Yes      Yes                                 +---------+---------------+---------+-----------+----------+--------------+ SFJ      Full                                                        +---------+---------------+---------+-----------+----------+--------------+ FV Prox  Full                                                        +---------+---------------+---------+-----------+----------+--------------+ FV Mid   Full                                                        +---------+---------------+---------+-----------+----------+--------------+ FV DistalFull                                                        +---------+---------------+---------+-----------+----------+--------------+  PFV      Full                                                        +---------+---------------+---------+-----------+----------+--------------+ POP      Full           Yes      Yes                                 +---------+---------------+---------+-----------+----------+--------------+ PTV      Full                                                        +---------+---------------+---------+-----------+----------+--------------+ PERO     Full                                                        +---------+---------------+---------+-----------+----------+--------------+   +---------+---------------+---------+-----------+----------+--------------+ LEFT      CompressibilityPhasicitySpontaneityPropertiesThrombus Aging +---------+---------------+---------+-----------+----------+--------------+ CFV      Full           Yes      Yes                                 +---------+---------------+---------+-----------+----------+--------------+ SFJ      Full                                                        +---------+---------------+---------+-----------+----------+--------------+ FV Prox  Full                                                        +---------+---------------+---------+-----------+----------+--------------+ FV Mid   Full                                                        +---------+---------------+---------+-----------+----------+--------------+ FV DistalFull                                                        +---------+---------------+---------+-----------+----------+--------------+ PFV      Full                                                        +---------+---------------+---------+-----------+----------+--------------+  POP      Full           Yes      Yes                                 +---------+---------------+---------+-----------+----------+--------------+ PTV      Full                                                        +---------+---------------+---------+-----------+----------+--------------+ PERO     Full                                                        +---------+---------------+---------+-----------+----------+--------------+     Summary: BILATERAL: - No evidence of deep vein thrombosis seen in the lower extremities, bilaterally. - No evidence of superficial venous thrombosis in the lower extremities, bilaterally. -No evidence of popliteal cyst, bilaterally.   *See table(s) above for measurements and observations. Electronically signed by Waverly Ferrarihristopher Dickson MD on 10/12/2020 at 3:07:17 PM.    Final        LOS: 2 days   Osvaldo ShipperGokul Camisha Srey  [facility] Pager on [email or web address]  10/13/2020, 12:27 PM

## 2020-10-14 DIAGNOSIS — U071 COVID-19: Secondary | ICD-10-CM | POA: Diagnosis not present

## 2020-10-14 DIAGNOSIS — E870 Hyperosmolality and hypernatremia: Secondary | ICD-10-CM

## 2020-10-14 DIAGNOSIS — N179 Acute kidney failure, unspecified: Secondary | ICD-10-CM | POA: Diagnosis not present

## 2020-10-14 DIAGNOSIS — R410 Disorientation, unspecified: Secondary | ICD-10-CM | POA: Diagnosis not present

## 2020-10-14 LAB — CBC WITH DIFFERENTIAL/PLATELET
Abs Immature Granulocytes: 0.19 10*3/uL — ABNORMAL HIGH (ref 0.00–0.07)
Basophils Absolute: 0 10*3/uL (ref 0.0–0.1)
Basophils Relative: 0 %
Eosinophils Absolute: 0 10*3/uL (ref 0.0–0.5)
Eosinophils Relative: 0 %
HCT: 39.5 % (ref 39.0–52.0)
Hemoglobin: 13.6 g/dL (ref 13.0–17.0)
Immature Granulocytes: 2 %
Lymphocytes Relative: 17 %
Lymphs Abs: 1.5 10*3/uL (ref 0.7–4.0)
MCH: 36.2 pg — ABNORMAL HIGH (ref 26.0–34.0)
MCHC: 34.4 g/dL (ref 30.0–36.0)
MCV: 105.1 fL — ABNORMAL HIGH (ref 80.0–100.0)
Monocytes Absolute: 0.5 10*3/uL (ref 0.1–1.0)
Monocytes Relative: 6 %
Neutro Abs: 6.4 10*3/uL (ref 1.7–7.7)
Neutrophils Relative %: 75 %
Platelets: 128 10*3/uL — ABNORMAL LOW (ref 150–400)
RBC: 3.76 MIL/uL — ABNORMAL LOW (ref 4.22–5.81)
RDW: 18.1 % — ABNORMAL HIGH (ref 11.5–15.5)
WBC: 8.7 10*3/uL (ref 4.0–10.5)
nRBC: 0.9 % — ABNORMAL HIGH (ref 0.0–0.2)

## 2020-10-14 LAB — COMPREHENSIVE METABOLIC PANEL
ALT: 18 U/L (ref 0–44)
AST: 21 U/L (ref 15–41)
Albumin: 2.7 g/dL — ABNORMAL LOW (ref 3.5–5.0)
Alkaline Phosphatase: 22 U/L — ABNORMAL LOW (ref 38–126)
Anion gap: 15 (ref 5–15)
BUN: 40 mg/dL — ABNORMAL HIGH (ref 8–23)
CO2: 24 mmol/L (ref 22–32)
Calcium: 8.8 mg/dL — ABNORMAL LOW (ref 8.9–10.3)
Chloride: 112 mmol/L — ABNORMAL HIGH (ref 98–111)
Creatinine, Ser: 1.28 mg/dL — ABNORMAL HIGH (ref 0.61–1.24)
GFR, Estimated: >60 mL/min (ref >60)
Glucose, Bld: 144 mg/dL — ABNORMAL HIGH (ref 70–99)
Potassium: 3.1 mmol/L — ABNORMAL LOW (ref 3.5–5.1)
Sodium: 151 mmol/L — ABNORMAL HIGH (ref 135–145)
Total Bilirubin: 1.7 mg/dL — ABNORMAL HIGH (ref 0.3–1.2)
Total Protein: 6.5 g/dL (ref 6.5–8.1)

## 2020-10-14 LAB — GLUCOSE, CAPILLARY
Glucose-Capillary: 152 mg/dL — ABNORMAL HIGH (ref 70–99)
Glucose-Capillary: 154 mg/dL — ABNORMAL HIGH (ref 70–99)
Glucose-Capillary: 176 mg/dL — ABNORMAL HIGH (ref 70–99)
Glucose-Capillary: 183 mg/dL — ABNORMAL HIGH (ref 70–99)
Glucose-Capillary: 181 mg/dL — ABNORMAL HIGH (ref 70–99)
Glucose-Capillary: 179 mg/dL — ABNORMAL HIGH (ref 70–99)

## 2020-10-14 LAB — MAGNESIUM: Magnesium: 2.3 mg/dL (ref 1.7–2.4)

## 2020-10-14 LAB — C-REACTIVE PROTEIN: CRP: 14.8 mg/dL — ABNORMAL HIGH (ref <1.0)

## 2020-10-14 LAB — D-DIMER, QUANTITATIVE: D-Dimer, Quant: 1.43 ug/mL-FEU — ABNORMAL HIGH (ref 0.00–0.50)

## 2020-10-14 MED ORDER — DEXTROSE 5 % IV SOLN: INTRAVENOUS | Status: DC

## 2020-10-14 MED ORDER — POTASSIUM CHLORIDE CRYS ER 20 MEQ PO TBCR
40.0000 meq | EXTENDED_RELEASE_TABLET | Freq: Two times a day (BID) | ORAL | Status: AC
  Administered 2020-10-14 (×2): 40 meq via ORAL
  Filled 2020-10-14 (×2): qty 2

## 2020-10-14 MED ORDER — HALOPERIDOL LACTATE 5 MG/ML IJ SOLN
2.0000 mg | Freq: Four times a day (QID) | INTRAMUSCULAR | Status: DC | PRN
  Administered 2020-10-14: 2 mg via INTRAMUSCULAR
  Filled 2020-10-14: qty 1

## 2020-10-14 MED ORDER — POTASSIUM CL IN DEXTROSE 5% 20 MEQ/L IV SOLN
20.0000 meq | INTRAVENOUS | Status: DC
  Administered 2020-10-14 – 2020-10-15 (×2): 20 meq via INTRAVENOUS
  Filled 2020-10-14 (×2): qty 1000

## 2020-10-14 NOTE — Progress Notes (Signed)
Pts daughter Kristian Mogg returned call. Notified and made aware of her fathers situation. 2 point soft wrist restraints in place, as well as telesitter. Pts daughter Misty Stanley verbalized understanding.

## 2020-10-14 NOTE — Significant Event (Signed)
Rapid Response Event Note   Reason for Call :  Asked to see pt as a second set of eyes for progressive confusion starting yesterday AM  Per notes, when pt was admitted, he was alert and oriented with no confusion. Confusion started yesterday AM. This was thought to be delirium for which haldol was ordered. Per nursing, confusion has gotten progressively worse despite haldol.   Initial Focused Assessment:  Pt lying in bed with eyes opened. He is alert and oriented x 4 but has to think about his answers for a minute before answering. He says he is confused. He moves everything equally and follows commands. His pupils are 3s and equal. His lungs are diminished t/o. His skin hot to touch.   T-98.5, HR-100, BP-155/92, RR-23, SpO2-94% on 2L Finger.    Interventions:  No RRT interventions  Plan of Care:  Pt has no focal deficits. He moves everything equally and follows commands. He is oriented to person, place, time, and situation but is also confused and, per nursing staff, keeps making vulgar comments. His VS/CBGs are stable. His Na from 1/22 is 150(up from 146). He has labs already  ordered for the AM. Per his dtr, pt does not drink alcohol or take any non-prescription meds.  Continue haldol for confusion/delirium. Monitor pt closely. Call RRT if further assistance needed.   Event Summary:   MD Notified: MD already aware Call Time:0049 Arrival Time:0140 End [id number]  Terrilyn Saver, RN

## 2020-10-14 NOTE — Plan of Care (Signed)
Problem: Education: Goal: Knowledge of General Education information will improve Description: Including pain rating scale, medication(s)/side effects and non-pharmacologic comfort measures 10/14/2020 0020 by Royetta Crochet, RN Outcome: Progressing 10/14/2020 0016 by Royetta Crochet, RN Outcome: Progressing   Problem: Health Behavior/Discharge Planning: Goal: Ability to manage health-related needs will improve 10/14/2020 0020 by Royetta Crochet, RN Outcome: Progressing 10/14/2020 0016 by Royetta Crochet, RN Outcome: Progressing   Problem: Clinical Measurements: Goal: Ability to maintain clinical measurements within normal limits will improve 10/14/2020 0020 by Royetta Crochet, RN Outcome: Progressing 10/14/2020 0016 by Royetta Crochet, RN Outcome: Progressing Goal: Will remain free from infection 10/14/2020 0020 by Royetta Crochet, RN Outcome: Progressing 10/14/2020 0016 by Royetta Crochet, RN Outcome: Progressing Goal: Diagnostic test results will improve 10/14/2020 0020 by Royetta Crochet, RN Outcome: Progressing 10/14/2020 0016 by Royetta Crochet, RN Outcome: Progressing Goal: Respiratory complications will improve 10/14/2020 0020 by Royetta Crochet, RN Outcome: Progressing 10/14/2020 0016 by Royetta Crochet, RN Outcome: Progressing Goal: Cardiovascular complication will be avoided 10/14/2020 0020 by Royetta Crochet, RN Outcome: Progressing 10/14/2020 0016 by Royetta Crochet, RN Outcome: Progressing   Problem: Activity: Goal: Risk for activity intolerance will decrease 10/14/2020 0020 by Royetta Crochet, RN Outcome: Progressing 10/14/2020 0016 by Royetta Crochet, RN Outcome: Progressing   Problem: Nutrition: Goal: Adequate nutrition will be maintained 10/14/2020 0020 by Royetta Crochet, RN Outcome: Progressing 10/14/2020 0016 by Royetta Crochet, RN Outcome: Progressing   Problem: Coping: Goal: Level of anxiety will decrease 10/14/2020 0020 by Royetta Crochet, RN Outcome: Progressing 10/14/2020 0016 by Royetta Crochet, RN Outcome: Progressing   Problem: Elimination: Goal: Will not experience complications related to bowel motility 10/14/2020 0020 by Royetta Crochet, RN Outcome: Progressing 10/14/2020 0016 by Royetta Crochet, RN Outcome: Progressing Goal: Will not experience complications related to urinary retention 10/14/2020 0020 by Royetta Crochet, RN Outcome: Progressing 10/14/2020 0016 by Royetta Crochet, RN Outcome: Progressing   Problem: Pain Managment: Goal: General experience of comfort will improve 10/14/2020 0020 by Royetta Crochet, RN Outcome: Progressing 10/14/2020 0016 by Royetta Crochet, RN Outcome: Progressing   Problem: Safety: Goal: Ability to remain free from injury will improve 10/14/2020 0020 by Royetta Crochet, RN Outcome: Progressing 10/14/2020 0016 by Royetta Crochet, RN Outcome: Progressing   Problem: Skin Integrity: Goal: Risk for impaired skin integrity will decrease 10/14/2020 0020 by Royetta Crochet, RN Outcome: Progressing 10/14/2020 0016 by Royetta Crochet, RN Outcome: Progressing   Problem: Education: Goal: Knowledge of risk factors and measures for prevention of condition will improve 10/14/2020 0020 by Royetta Crochet, RN Outcome: Progressing 10/14/2020 0016 by Royetta Crochet, RN Outcome: Progressing   Problem: Coping: Goal: Psychosocial and spiritual needs will be supported 10/14/2020 0020 by Royetta Crochet, RN Outcome: Progressing 10/14/2020 0016 by Royetta Crochet, RN Outcome: Progressing   Problem: Respiratory: Goal: Will maintain a patent airway 10/14/2020 0020 by Royetta Crochet, RN Outcome: Progressing 10/14/2020 0016 by Royetta Crochet, RN Outcome: Progressing Goal: Complications related to the disease process, condition or treatment will be avoided or minimized 10/14/2020 0020 by Royetta Crochet, RN Outcome: Progressing 10/14/2020 0016 by Royetta Crochet,  RN Outcome: Progressing   Problem: Safety: Goal: Non-violent Restraint(s) 10/14/2020 0020 by Royetta Crochet, RN Outcome: Progressing 10/14/2020 0016 by Royetta Crochet, RN Outcome: Progressing   Problem: Safety: Goal: Non-violent Restraint(s) 10/14/2020 0020 by Donata Clay  M, RN Outcome: Progressing 10/14/2020 0016 by Royetta Crochet, RN Outcome: Progressing

## 2020-10-14 NOTE — Plan of Care (Signed)
  Problem: Education: Goal: Knowledge of General Education information will improve Description: Including pain rating scale, medication(s)/side effects and non-pharmacologic comfort measures Outcome: Progressing   Problem: Health Behavior/Discharge Planning: Goal: Ability to manage health-related needs will improve Outcome: Progressing   Problem: Clinical Measurements: Goal: Ability to maintain clinical measurements within normal limits will improve Outcome: Progressing Goal: Will remain free from infection Outcome: Progressing Goal: Diagnostic test results will improve Outcome: Progressing Goal: Respiratory complications will improve Outcome: Progressing Goal: Cardiovascular complication will be avoided Outcome: Progressing   Problem: Activity: Goal: Risk for activity intolerance will decrease Outcome: Progressing   Problem: Nutrition: Goal: Adequate nutrition will be maintained Outcome: Progressing   Problem: Coping: Goal: Level of anxiety will decrease Outcome: Progressing   Problem: Elimination: Goal: Will not experience complications related to bowel motility Outcome: Progressing Goal: Will not experience complications related to urinary retention Outcome: Progressing   Problem: Pain Managment: Goal: General experience of comfort will improve Outcome: Progressing   Problem: Safety: Goal: Ability to remain free from injury will improve Outcome: Progressing   Problem: Skin Integrity: Goal: Risk for impaired skin integrity will decrease Outcome: Progressing   Problem: Education: Goal: Knowledge of risk factors and measures for prevention of condition will improve Outcome: Progressing   Problem: Coping: Goal: Psychosocial and spiritual needs will be supported Outcome: Progressing   Problem: Respiratory: Goal: Will maintain a patent airway Outcome: Progressing Goal: Complications related to the disease process, condition or treatment will be avoided or  minimized Outcome: Progressing   Problem: Safety: Goal: Non-violent Restraint(s) Outcome: Progressing   Problem: Safety: Goal: Non-violent Restraint(s) Outcome: Progressing   

## 2020-10-14 NOTE — Progress Notes (Signed)
PROGRESS NOTE  Joseph Crawford TDS:[id number] DOB: 1950/11/24 DOA: Oct 13, 2020  PCP: Hurshel Party, NP  Brief History/Interval Summary: 70 y.o. 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  The patient has not been vaccinated against COVID-19.  He tested positive for COVID-19.  Chest x-ray showed patchy airspace disease.  Patient was hospitalized for further management.  Reason for Visit: Pneumonia due to COVID-19.  Acute respiratory failure with hypoxia  Consultants: None  Procedures: None  Antibiotics: Anti-infectives (From admission, onward)   Start     Dose/Rate Route Frequency Ordered Stop   10/12/20 1000  remdesivir 100 mg in sodium chloride 0.9 % 100 mL IVPB       "Followed by" Linked Group Details   100 mg 200 mL/hr over 30 Minutes Intravenous Daily 10-13-20 1636 10/16/20 0959   13-Oct-2020 1900  remdesivir 200 mg in sodium chloride 0.9% 250 mL IVPB       "Followed by" Linked Group Details   200 mg 580 mL/hr over 30 Minutes Intravenous Once October 13, 2020 1636 13-Oct-2020 2028      Subjective/Interval History: Patient noted to be delirious this morning. Does not answer questions appropriately. Had to be restrained overnight.    Assessment/Plan:  Acute Hypoxic Resp. Failure/Pneumonia due to COVID-19  Recent Labs  Lab 10/13/20 1612 2020-10-13 2246 10/12/20 0232 10/13/20 0251 10/14/20 0301  DDIMER 1.48* 1.60* 1.67* 1.28* 1.43*  FERRITIN 343* 360* 352*  --   --   CRP 22.7* 25.4* 25.1* 17.8* 14.8*  ALT 18  --  16 17 18   PROCALCITON 0.20 0.13  --   --   --     Objective findings: Fever: Remains afebrile Oxygen requirements: 2 L nasal cannula saturating in the early 90s  COVID 19  Therapeutics: Antibacterials: Procalcitonin was 0.13.  Not on any antibacterials currently. Remdesivir: Day 4 Steroids:  Solu-Medrol Diuretics: None Inhaled Steroids: None Actemra/Baricitinib: No clear indication as yet PUD Prophylaxis: Noted to be on famotidine DVT Prophylaxis:  Lovenox   Patient tested positive on 1/20.  From a respiratory standpoint patient seems to be stable. Requiring minimal oxygen. D-dimer stable. CRP has improved. Continue steroids. Dose had to be reduced due to encephalopathy. Lower extremity Doppler study did not show any DVT. He remains on Remdesivir. Did not require Actemra or baricitinib.  The treatment plan and use of medications and known side effects were discussed with patient/family. Some of the medications used are based on case reports/anecdotal data.  All other medications being used in the management of COVID-19 based on limited study data.  Complete risks and long-term side effects are unknown, however in the best clinical judgment they seem to be of some benefit.  Patient/family wanted to proceed with treatment options provided.  Acute kidney injury/hypernatremia/hypokalemia No recent labs available.  The last creatinine we have in the EMR is from 2015 through [facility] when creatinine was 1.2.  Creatinine was noted to be 2.6 at admission with a BUN of 53.  Thought to be acute kidney injury.   Patient was gently hydrated. Renal function has improved. Monitor urine output. Sodium level remains elevated. Likely due to free water deficit. Due to encephalopathy/delirium he is unable to take orally. Continue D5 infusion. Replace potassium. Magnesium 2.3.  Hospital-acquired delirium Hospital-acquired delirium. CT head did not show any acute findings. UA was done which did not show any acute UTI either. Continue Haldol as needed. QT interval was normal. Reorient daily.  Diabetes mellitus type 2 Holding metformin.  Monitor CBGs.  HbA1c 6.4.  Mild  thrombocytopenia Likely due to acute illness.  No evidence of bleeding. Stable. Continue to monitor.  Essential hypertension Monitor blood pressures closely.  Noted to be on metoprolol.  Hyzaar on hold.  Sinus tachycardia Likely due to agitation. Check TSH.  GERD Continue famotidine   DVT Prophylaxis: Lovenox Code Status: Full code Family Communication: Daughter being updated daily. Disposition Plan: Hopefully return home when improved  Status is: Inpatient  Remains inpatient appropriate because:IV treatments appropriate due to intensity of illness or inability to take PO and Inpatient level of care appropriate due to severity of illness   Dispo: The patient is from: Home              Anticipated d/c is to: Home              Anticipated d/c date is: 3 days              Patient currently is not medically stable to d/c.     Medications:  Scheduled: . vitamin C  500 mg Oral Daily  . aspirin  325 mg Oral Daily  . cholecalciferol  1,000 Units Oral Daily  . enoxaparin (LOVENOX) injection  40 mg Subcutaneous Q24H  . famotidine  20 mg Oral BID  . feeding supplement (GLUCERNA SHAKE)  237 mL Oral TID BM  . fenofibrate  160 mg Oral Daily  . guaiFENesin  600 mg Oral BID  . insulin aspart  0-5 Units Subcutaneous QHS  . insulin aspart  0-9 Units Subcutaneous TID WC  . methylPREDNISolone (SOLU-MEDROL) injection  40 mg Intravenous Daily  . metoprolol succinate  25 mg Oral BID  . omega-3 acid ethyl esters  1 g Oral BID  . pantoprazole  20 mg Oral Daily  . potassium chloride  40 mEq Oral BID  . zinc sulfate  220 mg Oral Daily   Continuous: . dextrose    . remdesivir 100 mg in NS 100 mL 100 mg (10/14/20 0855)   [facility]:[id number], guaiFENesin-dextromethorphan, haloperidol lactate, labetalol, ondansetron **OR** ondansetron (ZOFRAN) IV, polyethylene glycol   Objective:  Vital Signs  Vitals:   10/13/20 1929 10/14/20 0153 10/14/20 0554 10/14/20 0749  BP: (!)  119/91 (!) 155/92 132/69 (!) 141/75  Pulse: 72 100 100 (!) 126  Resp: 20 (!) 23 20 (!) 22  Temp: 97.8 F (36.6 C) 98.5 F (36.9 C) 98.4 F (36.9 C) 98.8 F (37.1 C)  TempSrc: Oral Axillary Axillary Axillary  SpO2: 95% 94% 96% 93%  Weight:      Height:        Intake/Output Summary (Last 24 hours) at 10/14/2020 1105 Last data filed at 10/14/2020 0730 Gross per 24 hour  Intake 850 ml  Output 1300 ml  Net -450 ml   Filed Weights   10/09/2020 1625 10/13/20 0457  Weight: 79.4 kg 75.1 kg    General appearance: Awake alert. Confused. Distracted Resp: Mildly tachypneic. Coarse breath sounds with crackles bilateral bases. No wheezing or rhonchi. Cardio: S1-S2 is tachycardic regular.  No S3-S4.  No rubs murmurs or bruit. Telemetry shows sinus tachycardia. No further episodes of NSVT. GI: Abdomen is soft.  Nontender nondistended.  Bowel sounds are present normal.  No masses organomegaly Extremities: No edema.  Neurologic: Disoriented. No focal neurological deficits.     Lab Results:  Data Reviewed: I have personally reviewed following labs and imaging studies  CBC: Recent Labs  Lab 10/16/2020 1612 10/12/20 0232 10/13/20 0251 10/14/20 0301  WBC 7.6 6.8 8.2 8.7  NEUTROABS 5.3 5.2 6.1 6.4  HGB 13.0 12.0* 13.1 13.6  HCT 36.3* 34.0* 38.7* 39.5  MCV 107.7* 105.9* 106.0* 105.1*  PLT 125* 117* 141* 128*    Basic Metabolic Panel: Recent Labs  Lab 10/07/2020 1612 10/12/20 0232 10/13/20 0251 10/14/20 0301  NA 146* 146* 150* 151*  K 3.6 3.8 3.6 3.1*  CL 107 109 109 112*  CO2 23 23 25 24   GLUCOSE 149* 198* 167* 144*  BUN 53* 55* 49* 40*  CREATININE 2.64* 2.02* 1.45* 1.28*  CALCIUM 8.9 8.5* 8.6* 8.8*  MG  --   --   --  2.3    GFR: Estimated Creatinine Clearance: 57 mL/min (A) (by C-G formula based on SCr of 1.28 mg/dL (H)).  Liver Function Tests: Recent Labs  Lab 10/01/2020 1612 10/12/20 0232 10/13/20 0251 10/14/20 0301  AST 23 18 19 21   ALT 18 16 17 18   ALKPHOS 21*  20* 20* 22*  BILITOT 1.6* 1.4* 1.4* 1.7*  PROT 6.7 6.1* 6.3* 6.5  ALBUMIN 3.0* 2.6* 2.6* 2.7*     HbA1C: Recent Labs    10/12/20 0232  HGBA1C 6.4*    CBG: Recent Labs  Lab 10/13/20 0828 10/13/20 1213 10/13/20 1622 10/13/20 2139 10/14/20 0750  GLUCAP 154* 157* 175* 152* 176*    Lipid Profile: Recent Labs    10/08/2020 1532  TRIG 269*     Anemia Panel: Recent Labs    10/06/2020 2246 10/12/20 0232  FERRITIN 360* 352*    Recent Results (from the past 240 hour(s))  SARS Coronavirus 2 by RT PCR (hospital order, performed in [facility] lab) Nasopharyngeal Nasopharyngeal Swab     Status: Abnormal   Collection Time: 10/05/2020  2:03 PM   Specimen: Nasopharyngeal Swab  Result Value Ref Range Status   SARS Coronavirus 2 POSITIVE (A) NEGATIVE Final    Comment: emailed L. Berdik RN 15:10 10/04/2020 ([email or web address]) (NOTE) SARS-CoV-2 target nucleic acids are DETECTED  SARS-CoV-2 RNA is generally detectable in upper respiratory specimens  during the acute phase of infection.  Positive results are indicative  of the presence of the identified virus, but do not rule out bacterial infection or co-infection with other pathogens not detected by the test.  Clinical correlation with patient history and  other diagnostic information is necessary to determine patient infection status.  The expected result is negative.  Fact Sheet for Patients:   BoilerBrush.com.cyhttps://www.fda.gov/media/136312/download   Fact Sheet for Healthcare Providers:   https://pope.com/https://www.fda.gov/media/136313/download    This test is not yet approved or cleared by the [place] FDA and  has been authorized for detection and/or diagnosis of SARS-CoV-2 by FDA under an Emergency Use Authorization (EUA).  This EUA will remain in effect (meaning this test can be used) for the duration of  the  COVID-19 declaration under Section 564(b)(1) of the Act, 21 U.S.C. section 360-bbb-3(b)(1), unless the authorization is terminated or  revoked sooner.  Performed at [facility], [address] [place], [place] [address]   Blood Culture (routine x 2)     Status: None (Preliminary result)   Collection Time: 10/16/2020  4:12 PM   Specimen: BLOOD  Result Value Ref Range Status   Specimen Description BLOOD LEFT ANTECUBITAL  Final  Special Requests   Final    BOTTLES DRAWN AEROBIC AND ANAEROBIC Blood Culture results may not be optimal due to an excessive volume of blood received in culture bottles   Culture   Final    NO GROWTH 3 DAYS Performed at [facility], [address] [place], [place] [address]    Report Status PENDING  Incomplete  Blood Culture (routine x 2)     Status: None (Preliminary result)   Collection Time: 10/06/2020  4:15 PM   Specimen: BLOOD LEFT ARM  Result Value Ref Range Status   Specimen Description BLOOD LEFT ARM  Final   Special Requests   Final    BOTTLES DRAWN AEROBIC AND ANAEROBIC Blood Culture adequate volume   Culture   Final    NO GROWTH 3 DAYS Performed at [facility], [address] [place], [place] [address]    Report Status PENDING  Incomplete      Radiology Studies: CT HEAD WO CONTRAST  Result Date: 10/13/2020 CLINICAL DATA:  70 year old male with altered mental status. EXAM: CT HEAD WITHOUT CONTRAST TECHNIQUE: Contiguous axial images were obtained from the base of the skull through the vertex without intravenous contrast. COMPARISON:  Head CT dated 08/23/2012. FINDINGS: Brain: Mild age-related atrophy and chronic microvascular ischemic changes. There is no acute intracranial hemorrhage. No mass effect or midline shift no extra-axial fluid collection. Vascular: No hyperdense vessel or unexpected calcification. Skull: Normal. Negative for fracture or focal lesion. Sinuses/Orbits: No acute finding. Other: None IMPRESSION: 1. No acute intracranial pathology. 2. Mild age-related atrophy and chronic microvascular ischemic changes. Electronically Signed   By: Elgie CollardArash   Radparvar M.D.   On: 10/13/2020 19:22   VAS US LOWER EXTREMITY VENOUS (DVT)  Result Date: 10/12/2020  Lower Venous DVT Study Indications: Covid, high d dimer.  Comparison Study: no prior Performing Technologist: Blanch MediaMegan Riddle RVS  Examination Guidelines: A complete evaluation includes B-mode imaging, spectral Doppler, color Doppler, and power Doppler as needed of all accessible portions of each vessel. Bilateral testing is considered an integral part of a complete examination. Limited examinations for reoccurring indications may be performed as noted. The reflux portion of the exam is performed with the patient in reverse Trendelenburg.  +---------+---------------+---------+-----------+----------+--------------+ RIGHT    CompressibilityPhasicitySpontaneityPropertiesThrombus Aging +---------+---------------+---------+-----------+----------+--------------+ CFV      Full           Yes      Yes                                 +---------+---------------+---------+-----------+----------+--------------+ SFJ      Full                                                        +---------+---------------+---------+-----------+----------+--------------+ FV Prox  Full                                                        +---------+---------------+---------+-----------+----------+--------------+ FV Mid   Full                                                        +---------+---------------+---------+-----------+----------+--------------+  FV DistalFull                                                        +---------+---------------+---------+-----------+----------+--------------+ PFV      Full                                                        +---------+---------------+---------+-----------+----------+--------------+ POP      Full           Yes      Yes                                 +---------+---------------+---------+-----------+----------+--------------+ PTV       Full                                                        +---------+---------------+---------+-----------+----------+--------------+ PERO     Full                                                        +---------+---------------+---------+-----------+----------+--------------+   +---------+---------------+---------+-----------+----------+--------------+ LEFT     CompressibilityPhasicitySpontaneityPropertiesThrombus Aging +---------+---------------+---------+-----------+----------+--------------+ CFV      Full           Yes      Yes                                 +---------+---------------+---------+-----------+----------+--------------+ SFJ      Full                                                        +---------+---------------+---------+-----------+----------+--------------+ FV Prox  Full                                                        +---------+---------------+---------+-----------+----------+--------------+ FV Mid   Full                                                        +---------+---------------+---------+-----------+----------+--------------+ FV DistalFull                                                        +---------+---------------+---------+-----------+----------+--------------+  PFV      Full                                                        +---------+---------------+---------+-----------+----------+--------------+ POP      Full           Yes      Yes                                 +---------+---------------+---------+-----------+----------+--------------+ PTV      Full                                                        +---------+---------------+---------+-----------+----------+--------------+ PERO     Full                                                        +---------+---------------+---------+-----------+----------+--------------+     Summary: BILATERAL: - No evidence of deep vein  thrombosis seen in the lower extremities, bilaterally. - No evidence of superficial venous thrombosis in the lower extremities, bilaterally. -No evidence of popliteal cyst, bilaterally.   *See table(s) above for measurements and observations. Electronically signed by Waverly Ferrari MD on 10/12/2020 at 3:07:17 PM.    Final        LOS: 3 days   Osvaldo Shipper  [facility] Pager on [email or web address]  10/14/2020, 11:05 AM

## 2020-10-15 ENCOUNTER — Inpatient Hospital Stay (HOSPITAL_COMMUNITY): Payer: Medicare Other

## 2020-10-15 DIAGNOSIS — E870 Hyperosmolality and hypernatremia: Secondary | ICD-10-CM | POA: Diagnosis not present

## 2020-10-15 DIAGNOSIS — R509 Fever, unspecified: Secondary | ICD-10-CM

## 2020-10-15 DIAGNOSIS — U071 COVID-19: Secondary | ICD-10-CM | POA: Diagnosis not present

## 2020-10-15 DIAGNOSIS — N179 Acute kidney failure, unspecified: Secondary | ICD-10-CM | POA: Diagnosis not present

## 2020-10-15 DIAGNOSIS — R410 Disorientation, unspecified: Secondary | ICD-10-CM | POA: Diagnosis not present

## 2020-10-15 LAB — CBC WITH DIFFERENTIAL/PLATELET
Abs Immature Granulocytes: 0.25 10*3/uL — ABNORMAL HIGH (ref 0.00–0.07)
Basophils Absolute: 0 10*3/uL (ref 0.0–0.1)
Basophils Relative: 0 %
Eosinophils Absolute: 0 10*3/uL (ref 0.0–0.5)
Eosinophils Relative: 0 %
HCT: 36.8 % — ABNORMAL LOW (ref 39.0–52.0)
Hemoglobin: 13.1 g/dL (ref 13.0–17.0)
Immature Granulocytes: 3 %
Lymphocytes Relative: 10 %
Lymphs Abs: 1 10*3/uL (ref 0.7–4.0)
MCH: 37.2 pg — ABNORMAL HIGH (ref 26.0–34.0)
MCHC: 35.6 g/dL (ref 30.0–36.0)
MCV: 104.5 fL — ABNORMAL HIGH (ref 80.0–100.0)
Monocytes Absolute: 0.4 10*3/uL (ref 0.1–1.0)
Monocytes Relative: 4 %
Neutro Abs: 8 10*3/uL — ABNORMAL HIGH (ref 1.7–7.7)
Neutrophils Relative %: 83 %
Platelets: 95 10*3/uL — ABNORMAL LOW (ref 150–400)
RBC: 3.52 MIL/uL — ABNORMAL LOW (ref 4.22–5.81)
RDW: 18.4 % — ABNORMAL HIGH (ref 11.5–15.5)
WBC: 9.6 10*3/uL (ref 4.0–10.5)
nRBC: 1.3 % — ABNORMAL HIGH (ref 0.0–0.2)

## 2020-10-15 LAB — COMPREHENSIVE METABOLIC PANEL
ALT: 23 U/L (ref 0–44)
AST: 25 U/L (ref 15–41)
Albumin: 2.4 g/dL — ABNORMAL LOW (ref 3.5–5.0)
Alkaline Phosphatase: 26 U/L — ABNORMAL LOW (ref 38–126)
Anion gap: 15 (ref 5–15)
BUN: 30 mg/dL — ABNORMAL HIGH (ref 8–23)
CO2: 21 mmol/L — ABNORMAL LOW (ref 22–32)
Calcium: 8.5 mg/dL — ABNORMAL LOW (ref 8.9–10.3)
Chloride: 114 mmol/L — ABNORMAL HIGH (ref 98–111)
Creatinine, Ser: 1.34 mg/dL — ABNORMAL HIGH (ref 0.61–1.24)
GFR, Estimated: 57 mL/min — ABNORMAL LOW (ref >60)
Glucose, Bld: 277 mg/dL — ABNORMAL HIGH (ref 70–99)
Potassium: 3.4 mmol/L — ABNORMAL LOW (ref 3.5–5.1)
Sodium: 150 mmol/L — ABNORMAL HIGH (ref 135–145)
Total Bilirubin: 2.7 mg/dL — ABNORMAL HIGH (ref 0.3–1.2)
Total Protein: 5.8 g/dL — ABNORMAL LOW (ref 6.5–8.1)

## 2020-10-15 LAB — BLOOD GAS, ARTERIAL
Acid-base deficit: 0.3 mmol/L (ref 0.0–2.0)
Bicarbonate: 22.5 mmol/L (ref 20.0–28.0)
Drawn by: 252031
FIO2: 28
O2 Saturation: 90.6 %
Patient temperature: 37.8
pCO2 arterial: 29.9 mmHg — ABNORMAL LOW (ref 32.0–48.0)
pH, Arterial: 7.492 — ABNORMAL HIGH (ref 7.350–7.450)
pO2, Arterial: 60.2 mmHg — ABNORMAL LOW (ref 83.0–108.0)

## 2020-10-15 LAB — URINALYSIS, ROUTINE W REFLEX MICROSCOPIC
Bacteria, UA: NONE SEEN
Bilirubin Urine: NEGATIVE
Glucose, UA: 150 mg/dL — AB
Ketones, ur: 5 mg/dL — AB
Leukocytes,Ua: NEGATIVE
Nitrite: NEGATIVE
Protein, ur: 30 mg/dL — AB
Specific Gravity, Urine: 1.021 (ref 1.005–1.030)
pH: 5 (ref 5.0–8.0)

## 2020-10-15 LAB — GLUCOSE, CAPILLARY
Glucose-Capillary: 266 mg/dL — ABNORMAL HIGH (ref 70–99)
Glucose-Capillary: 268 mg/dL — ABNORMAL HIGH (ref 70–99)
Glucose-Capillary: 231 mg/dL — ABNORMAL HIGH (ref 70–99)
Glucose-Capillary: 288 mg/dL — ABNORMAL HIGH (ref 70–99)

## 2020-10-15 LAB — D-DIMER, QUANTITATIVE: D-Dimer, Quant: 0.76 ug/mL-FEU — ABNORMAL HIGH (ref 0.00–0.50)

## 2020-10-15 LAB — C-REACTIVE PROTEIN: CRP: 23.1 mg/dL — ABNORMAL HIGH (ref <1.0)

## 2020-10-15 MED ORDER — SODIUM CHLORIDE 0.9 % IV SOLN
3.0000 g | Freq: Four times a day (QID) | INTRAVENOUS | Status: DC
  Administered 2020-10-15 – 2020-10-17 (×8): 3 g via INTRAVENOUS
  Filled 2020-10-15: qty 3
  Filled 2020-10-15 (×10): qty 8

## 2020-10-15 MED ORDER — POTASSIUM CL IN DEXTROSE 5% 20 MEQ/L IV SOLN
20.0000 meq | INTRAVENOUS | Status: DC
  Administered 2020-10-15 – 2020-10-18 (×5): 20 meq via INTRAVENOUS
  Filled 2020-10-15 (×6): qty 1000

## 2020-10-15 MED ORDER — ACETAMINOPHEN 650 MG RE SUPP
650.0000 mg | RECTAL | Status: DC | PRN
  Administered 2020-10-15 – 2020-10-16 (×3): 650 mg via RECTAL
  Filled 2020-10-15 (×3): qty 1

## 2020-10-15 MED ORDER — POTASSIUM CHLORIDE 10 MEQ/100ML IV SOLN
10.0000 meq | INTRAVENOUS | Status: AC
  Administered 2020-10-15 (×2): 10 meq via INTRAVENOUS
  Filled 2020-10-15: qty 100

## 2020-10-15 NOTE — Progress Notes (Signed)
   10/15/20 0907  Assess: MEWS Score  Temp (!) 100.6 F (38.1 C)  BP 111/66  Pulse Rate (!) 105  ECG Heart Rate (!) 106  Resp (!) 31  Level of Consciousness Responds to Voice  SpO2 93 %  O2 Device Nasal Cannula  O2 Flow Rate (L/min) 2 L/min  Assess: MEWS Score  MEWS Temp 1  MEWS Systolic 0  MEWS Pulse 1  MEWS RR 2  MEWS LOC 1  MEWS Score 5  MEWS Score Color Red  Assess: if the MEWS score is Yellow or Red  Were vital signs taken at a resting state? Yes  Focused Assessment No change from prior assessment  Early Detection of Sepsis Score *See Row Information* Medium  MEWS guidelines implemented *See Row Information* No, previously red, continue vital signs every 4 hours  Take Vital Signs  Increase Vital Sign Frequency  Red: Q 1hr X 4 then Q 4hr X 4, if remains red, continue Q 4hrs  Escalate  MEWS: Escalate Red: discuss with charge nurse/RN and provider, consider discussing with RRT  Notify: Charge Nurse/RN  Name of Charge Nurse/RN Notified Christy, RN  Date Charge Nurse/RN Notified 10/15/20  Time Charge Nurse/RN Notified 0919  Notify: Provider  Provider Name/Title Wolfgang Phoenix, MD  Date Provider Notified 10/15/20  Time Provider Notified [phone]  Notification Type Page  Notification Reason Other (Comment) (temp 100.6)  Response See new orders  Date of Provider Response 10/15/20  Time of Provider Response [phone]  Document  Patient Outcome Stabilized after interventions  Progress note created (see row info) Yes

## 2020-10-15 NOTE — Evaluation (Signed)
Clinical/Bedside Swallow Evaluation Patient Details  Name: Joseph Crawford MRN: [id number] Date of Birth: October 19, 1950  Today's Date: 10/15/2020 Time: SLP Start Time (ACUTE ONLY): 1340 SLP Stop Time (ACUTE ONLY): 1350 SLP Time Calculation (min) (ACUTE ONLY): 10 min  Past Medical History: History reviewed. No pertinent past medical history. Past Surgical History: History reviewed. No pertinent surgical history. HPI:  70 y.o. 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  The patient has not been vaccinated against COVID-19.  He tested positive for COVID-19.  Chest x-ray showed patchy airspace disease.  Patient was hospitalized for further management.   Assessment / Plan / Recommendation Clinical Impression  Pt observed to be minimally responsive. Heavy cueing needed for pt to achieve awareness of bolus. When he did take a straw sip, coughing occurred. Pt barely accepted puree and exhibited oral holding. Recommend NPO at this time. SLP Visit Diagnosis: Dysphagia, unspecified (R13.10)    Aspiration Risk  Severe aspiration risk;Risk for inadequate nutrition/hydration    Diet Recommendation NPO;Alternative means - temporary        Other  Recommendations     Follow up Recommendations Skilled Nursing facility      Frequency and Duration min 2x/week  2 weeks       Prognosis Prognosis for Safe Diet Advancement: Guarded Barriers to Reach Goals: Severity of deficits      Swallow Study   General HPI: 71 y.o. Caucasian male with a known history of type 2 diabetes mellitus, coronary artery disease status post four-vessel CABG, dyslipidemia, hypertension and ongoing tobacco abuse, who presented to the emergency room with  acute onset of worsening dyspnea with associated dry cough as well as fever and chills for the last week.  The patient has not been vaccinated against COVID-19.  He tested positive for COVID-19.  Chest x-ray showed patchy airspace disease.  Patient was hospitalized for further management. Type of Study: Bedside Swallow Evaluation Previous Swallow Assessment: none Diet Prior to this Study: Regular;Thin liquids Temperature Spikes Noted: No Respiratory Status: Room air History of Recent Intubation: No Behavior/Cognition: Lethargic/Drowsy;Doesn't follow directions Oral Cavity Assessment: Within Functional Limits Oral Care Completed by SLP: No Oral Cavity - Dentition: Adequate natural dentition Vision: Impaired for self-feeding Self-Feeding Abilities: Total assist Patient Positioning: Upright in bed Baseline Vocal Quality: Not observed Volitional Cough: Cognitively unable to elicit Volitional Swallow: Unable to elicit    Oral/Motor/Sensory Function Overall Oral Motor/Sensory Function:  (Does not follow commands)   Ice Chips     Thin Liquid Thin Liquid: Impaired Presentation: Straw Oral Phase Impairments: Poor awareness of bolus Pharyngeal  Phase Impairments: Cough - Immediate    Nectar Thick Nectar Thick Liquid: Not tested   Honey Thick Honey Thick Liquid: Not tested   Puree Puree: Impaired Presentation: Spoon Oral Phase Impairments: Poor awareness of bolus   Solid           Harlon Ditty, MA CCC-SLP  Acute Rehabilitation Services Pager [phone] Office [phone]  Claudine Mouton 10/15/2020,4:09 PM

## 2020-10-15 NOTE — Progress Notes (Signed)
Pharmacy Antibiotic Note  Joseph Crawford is a 70 y.o. male admitted on 10/05/2020 with worsening shortness of breath.  Pharmacy has been consulted for unasyn dosing for possible aspiration pneumonia.  Decreased responsiveness this morning, febrile to 102.5, wbc within normal limits at 9.6. Renal function within normal limits  Plan: Unasyn 3g IV q6 hours  No dose adjustments expected, pharmacy to sign off and follow peripherally  Height: 6' (182.9 cm) Weight: 75.1 kg (165 lb 9.1 oz) IBW/kg (Calculated) : 77.6  Temp (24hrs), Avg:99.7 F (37.6 C), Min:98.3 F (36.8 C), Max:102.5 F (39.2 C)  Recent Labs  Lab 10/05/2020 1532 10/10/2020 1612 10/12/20 0232 10/13/20 0251 10/14/20 0301 10/15/20 0814  WBC  --  7.6 6.8 8.2 8.7  --   CREATININE  --  2.64* 2.02* 1.45* 1.28* 1.34*  LATICACIDVEN 1.2  --   --   --   --   --     Estimated Creatinine Clearance: 54.5 mL/min (A) (by C-G formula based on SCr of 1.34 mg/dL (H)).    Allergies  Allergen Reactions  . Statins Other (See Comments)    Per pt: muscle loss and weakness      Thank you for allowing pharmacy to be a part of this patient's care.  Sheppard Coil PharmD., BCPS Clinical Pharmacist 10/15/2020 10:17 AM

## 2020-10-15 NOTE — Progress Notes (Signed)
PROGRESS NOTE  Joseph Crawford CVE:[id number] DOB: 08-12-1951 DOA: 10/07/2020  PCP: Hurshel Party, NP  Brief History/Interval Summary: 70 y.o. 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  The patient has not been vaccinated against COVID-19.  He tested positive for COVID-19.  Chest x-ray showed patchy airspace disease.  Patient was hospitalized for further management.  Reason for Visit: Pneumonia due to COVID-19.  Acute respiratory failure with hypoxia  Consultants: None  Procedures: None  Antibiotics: Anti-infectives (From admission, onward)   Start     Dose/Rate Route Frequency Ordered Stop   10/15/20 1115  Ampicillin-Sulbactam (UNASYN) 3 g in sodium chloride 0.9 % 100 mL IVPB        3 g 200 mL/hr over 30 Minutes Intravenous Every 6 hours 10/15/20 1017     10/12/20 1000  remdesivir 100 mg in sodium chloride 0.9 % 100 mL IVPB       "Followed by" Linked Group Details   100 mg 200 mL/hr over 30 Minutes Intravenous Daily 09/27/2020 1636 10/15/20 1027   10/10/2020 1900  remdesivir 200 mg in sodium chloride 0.9% 250 mL IVPB       "Followed by" Linked Group Details   200 mg 580 mL/hr over 30 Minutes Intravenous Once 09/29/2020 1636 09/23/2020 2028      Subjective/Interval History: Patient noted to become more altered overnight.  Noted to be moaning this morning.  Does not respond to questions.  Noted to have a fever this morning    Assessment/Plan:  Acute Hypoxic Resp. Failure/Pneumonia due to COVID-19/concern for aspiration pneumonia  Recent Labs  Lab 10/09/2020 1612 10/12/2020 2246 10/12/20 0232 10/13/20 0251 10/14/20 0301 10/15/20 0814  DDIMER 1.48* 1.60* 1.67* 1.28* 1.43* 0.76*  FERRITIN 343* 360* 352*  --    --   --   CRP 22.7* 25.4* 25.1* 17.8* 14.8* 23.1*  ALT 18  --  16 17 18 23   PROCALCITON 0.20 0.13  --   --   --   --     Objective findings: Fever: Noted to be febrile this morning at 102.5 F rectally Oxygen requirements: Nasal cannula 2 L/min saturating in the early 90s  COVID 19 Therapeutics: Antibacterials: Started on Unasyn today Remdesivir: Day 5 Steroids:  Solu-Medrol Diuretics: None Inhaled Steroids: None Actemra/Baricitinib: No clear indication as yet PUD Prophylaxis: Noted to be on famotidine DVT Prophylaxis:  Lovenox   Patient tested positive on 1/20.  From a respiratory standpoint patient seems to be stable.  Currently stable on 2 L of oxygen.  His inflammatory markers have been improving but CRP noted to be higher today.  D-dimer has been improving.  Patient remains on Remdesivir steroids.  No indication for Actemra or baricitinib.  Lower extremity Doppler study did not show any DVT.  Dose of steroids were decreased due to encephalopathy.  The treatment plan and use of medications and known side effects were discussed with patient/family. Some of the medications used are based on case reports/anecdotal data.  All other medications being used in the management of COVID-19 based on limited study data.  Complete risks and long-term side effects are unknown, however in the best clinical judgment they seem to be of some benefit.  Patient/family wanted to proceed with treatment options  provided.  Fever WBC noted to be normal.  Patient does not have any meningeal signs.  We will do blood cultures.  Repeat UA although 1 from a few days ago was unremarkable.  Chest x-ray was done earlier this morning which did not show any significant changes.  It still possible he may have aspirated.  We will place him on Unasyn for now.  Acute metabolic encephalopathy/hospital-acquired delirium Patient mentation continues to worsen.  This could be secondary to fever today.  Covid likely also  contributing.  CT scan done recently did not show any acute findings.  We will repeat UA as discussed above.  Will discontinue sedative agents.  ABG does not show any hypercapnia.  Acute kidney injury/hypernatremia/hypokalemia No recent labs available.  The last creatinine we have in the EMR is from 2015 through [facility] when creatinine was 1.2.  Creatinine was noted to be 2.6 at admission with a BUN of 53.  Thought to be acute kidney injury.   Patient was gently hydrated. Renal function has improved.  Monitor urine output Sodium level remains elevated. Likely due to free water deficit. Due to encephalopathy/delirium he is unable to take orally.  Continue D5 infusion.  Replace potassium.  Magnesium was 2.3 yesterday.    Diabetes mellitus type 2 Holding metformin.  Monitor CBGs.  HbA1c 6.4.  Mild thrombocytopenia Likely due to acute illness.  No evidence of bleeding.  Platelet count noted to be lower today.  We will hold Lovenox for now.    Essential hypertension Monitor blood pressures.  Metoprolol IV as needed.  Sinus tachycardia Likely due to agitation. Check TSH.  GERD Continue famotidine   DVT Prophylaxis: Lovenox Code Status: Full code Family Communication: Daughter being updated daily. Disposition Plan: Hopefully return home when improved  Status is: Inpatient  Remains inpatient appropriate because:IV treatments appropriate due to intensity of illness or inability to take PO and Inpatient level of care appropriate due to severity of illness   Dispo: The patient is from: Home              Anticipated d/c is to: Home              Anticipated d/c date is: 3 days              Patient currently is not medically stable to d/c.     Medications:  Scheduled: . aspirin  325 mg Oral Daily  . enoxaparin (LOVENOX) injection  40 mg Subcutaneous Q24H  . insulin aspart  0-5 Units Subcutaneous QHS  . insulin aspart  0-9 Units Subcutaneous TID WC  . methylPREDNISolone  (SOLU-MEDROL) injection  40 mg Intravenous Daily   Continuous: . ampicillin-sulbactam (UNASYN) IV    . dextrose 5 % with KCl 20 mEq / L 20 mEq (10/15/20 0347)   XBJ:[id number]:acetaminophen, labetalol, [DISCONTINUED] ondansetron **OR** ondansetron (ZOFRAN) IV   Objective:  Vital Signs  Vitals:   10/15/20 0426 10/15/20 0600 10/15/20 0907 10/15/20 0940  BP: 110/76 106/74 111/66   Pulse: (!) 105  (!) 105   Resp: (!) 24 20 (!) 31   Temp: 99.8 F (37.7 C) 99.8 F (37.7 C) (!) 100.6 F (38.1 C) (!) 102.5 F (39.2 C)  TempSrc:  Axillary Axillary Rectal  SpO2:   93%   Weight:      Height:        Intake/Output Summary (Last 24 hours) at 10/15/2020 1153 Last data filed at 10/15/2020 0500 Gross per 24 hour  Intake 1000 ml  Output 2000 ml  Net -1000 ml   Filed Weights   10/01/2020 1625 10/13/20 0457  Weight: 79.4 kg 75.1 kg    General appearance: Patient noted to be poorly responsive this morning.  Noted to be moaning. Resp: Tachypneic.  Coarse breath sounds with crackles bilateral bases. Cardio: S1-S2 is normal regular.  No S3-S4.  No rubs murmurs or bruit GI: Abdomen is soft.  Nontender nondistended.  Bowel sounds are present normal.  No masses organomegaly Extremities: No edema.   Neurologic: No meningeal signs.  He is minimally responsive.  Moaning.  No facial asymmetry noted.  No obvious focal deficits.   Lab Results:  Data Reviewed: I have personally reviewed following labs and imaging studies  CBC: Recent Labs  Lab 09/23/2020 1612 10/12/20 0232 10/13/20 0251 10/14/20 0301 10/15/20 0814  WBC 7.6 6.8 8.2 8.7 9.6  NEUTROABS 5.3 5.2 6.1 6.4 8.0*  HGB 13.0 12.0* 13.1 13.6 13.1  HCT 36.3* 34.0* 38.7* 39.5 36.8*  MCV 107.7* 105.9* 106.0* 105.1* 104.5*  PLT 125* 117* 141* 128* 95*    Basic Metabolic Panel: Recent Labs  Lab 10/03/2020 1612 10/12/20 0232 10/13/20 0251 10/14/20 0301 10/15/20 0814  NA 146* 146* 150* 151* 150*  K 3.6 3.8 3.6 3.1* 3.4*  CL 107 109 109 112*  114*  CO2 23 23 25 24  21*  GLUCOSE 149* 198* 167* 144* 277*  BUN 53* 55* 49* 40* 30*  CREATININE 2.64* 2.02* 1.45* 1.28* 1.34*  CALCIUM 8.9 8.5* 8.6* 8.8* 8.5*  MG  --   --   --  2.3  --     GFR: Estimated Creatinine Clearance: 54.5 mL/min (A) (by C-G formula based on SCr of 1.34 mg/dL (H)).  Liver Function Tests: Recent Labs  Lab 10/18/2020 1612 10/12/20 0232 10/13/20 0251 10/14/20 0301 10/15/20 0814  AST 23 18 19 21 25   ALT 18 16 17 18 23   ALKPHOS 21* 20* 20* 22* 26*  BILITOT 1.6* 1.4* 1.4* 1.7* 2.7*  PROT 6.7 6.1* 6.3* 6.5 5.8*  ALBUMIN 3.0* 2.6* 2.6* 2.7* 2.4*    CBG: Recent Labs  Lab 10/13/20 2139 10/14/20 0750 10/14/20 1137 10/14/20 1559 10/14/20 2048  GLUCAP 152* 176* 183* 181* 179*      Recent Results (from the past 240 hour(s))  SARS Coronavirus 2 by RT PCR (hospital order, performed in [facility] hospital lab) Nasopharyngeal Nasopharyngeal Swab     Status: Abnormal   Collection Time: 09/22/2020  2:03 PM   Specimen: Nasopharyngeal Swab  Result Value Ref Range Status   SARS Coronavirus 2 POSITIVE (A) NEGATIVE Final    Comment: emailed L. Berdik RN 15:10 10/05/2020 ([email or web address]) (NOTE) SARS-CoV-2 target nucleic acids are DETECTED  SARS-CoV-2 RNA is generally detectable in upper respiratory specimens  during the acute phase of infection.  Positive results are indicative  of the presence of the identified virus, but do not rule out bacterial infection or co-infection with other pathogens not detected by the test.  Clinical correlation with patient history and  other diagnostic information is necessary to determine patient infection status.  The expected result is negative.  Fact Sheet for Patients:   [facility]   Fact Sheet for Healthcare Providers:   10/13/20    This test is not yet approved or cleared by the 10/13/20 FDA and  has been authorized for detection and/or diagnosis of  SARS-CoV-2 by FDA under an Emergency Use Authorization (EUA).  This EUA will remain in effect (meaning this test can be used) for  the duration of  the  COVID-19 declaration under Section 564(b)(1) of the Act, 21 U.S.C. section 360-bbb-3(b)(1), unless the authorization is terminated or revoked sooner.  Performed at [facility] Lab, [address] [place], [place] [address]   Blood Culture (routine x 2)     Status: None (Preliminary result)   Collection Time: November 10, 2020  4:12 PM   Specimen: BLOOD  Result Value Ref Range Status   Specimen Description BLOOD LEFT ANTECUBITAL  Final   Special Requests   Final    BOTTLES DRAWN AEROBIC AND ANAEROBIC Blood Culture results may not be optimal due to an excessive volume of blood received in culture bottles   Culture   Final    NO GROWTH 3 DAYS Performed at [facility], [address] [place], [place] [address]    Report Status PENDING  Incomplete  Blood Culture (routine x 2)     Status: None (Preliminary result)   Collection Time: 11-10-2020  4:15 PM   Specimen: BLOOD LEFT ARM  Result Value Ref Range Status   Specimen Description BLOOD LEFT ARM  Final   Special Requests   Final    BOTTLES DRAWN AEROBIC AND ANAEROBIC Blood Culture adequate volume   Culture   Final    NO GROWTH 3 DAYS Performed at [facility], [address] [place], [place] [address]    Report Status PENDING  Incomplete      Radiology Studies: CT HEAD WO CONTRAST  Result Date: 10/13/2020 CLINICAL DATA:  70 year old male with altered mental status. EXAM: CT HEAD WITHOUT CONTRAST TECHNIQUE: Contiguous axial images were obtained from the base of the skull through the vertex without intravenous contrast. COMPARISON:  Head CT dated 08/23/2012. FINDINGS: Brain: Mild age-related atrophy and chronic microvascular ischemic changes. There is no acute intracranial hemorrhage. No mass effect or midline shift no extra-axial fluid collection. Vascular: No hyperdense  vessel or unexpected calcification. Skull: Normal. Negative for fracture or focal lesion. Sinuses/Orbits: No acute finding. Other: None IMPRESSION: 1. No acute intracranial pathology. 2. Mild age-related atrophy and chronic microvascular ischemic changes. Electronically Signed   By: Elgie Collard M.D.   On: 10/13/2020 19:22   DG CHEST PORT 1 VIEW  Result Date: 10/15/2020 CLINICAL DATA:  Shortness of breath.  COVID positive EXAM: PORTABLE CHEST 1 VIEW COMPARISON:  Four days ago FINDINGS: Hazy opacity in the left more than right lung. Interface over the bilateral chest attributed to skin folds. Normal heart size and mediastinal contours. CABG. IMPRESSION: Mild pneumonia asymmetric to the left base. No significant change from 4 days ago. Electronically Signed   By: Marnee Spring M.D.   On: 10/15/2020 06:24       LOS: 4 days   Alisse Tuite Rito Ehrlich  [facility] Pager on [email or web address]  10/15/2020, 11:53 AM

## 2020-10-15 NOTE — TOC Initial Note (Signed)
Transition of Care [facility]) - Initial/Assessment Note    Patient Details  Name: Joseph Crawford MRN: [id number] Date of Birth: 1950/11/04  Transition of Care [facility]) CM/SW Contact:    Durenda Guthrie, RN Phone Number: 10/15/2020, 1:28 PM  Clinical Narrative:  Patient is a 70 yr old male admitted with Covid pneumonia. Case manager spoke with his daughter, Misty Stanley [phone], concerning discharge plan. Choice for Home Health Agencies was offered, referral was called to [place], [facility]. Patient has rolling walker at home. TOC will continue to follow for needs.             Expected Discharge Plan: Home w Home Health Services Barriers to Discharge: Continued Medical Work up   Patient Goals and CMS Choice Patient states their goals for this hospitalization and ongoing recovery are:: per daughter to get better CMS Medicare.gov Compare Post Acute Care list provided to:: Patient Represenative (must comment) Choice offered to / list presented to : Adult Children (daughter - Abdulraheem Pineo)  Expected Discharge Plan and Services Expected Discharge Plan: Home w Home Health Services   Discharge Planning Services: CM Consult Post Acute Care Choice: Home Health Living arrangements for the past 2 months: Single Family Home                 DME Arranged: N/A         HH Arranged: PT HH Agency: [facility] Date HH Agency Contacted: 10/15/20 Time HH Agency Contacted: 1327 Representative spoke with at [facility] Agency: Pearson Grippe  Prior Living Arrangements/Services Living arrangements for the past 2 months: Single Family Home Lives with:: Adult Children   Do you feel safe going back to the place where you live?: Yes      Need for Family Participation in Patient Care: Yes (Comment) Care giver support system in place?: Yes (comment)   Criminal Activity/Legal Involvement Pertinent to Current Situation/Hospitalization: No - Comment as needed  Activities of Daily  Living      Permission Sought/Granted         Permission granted to share info w AGENCY: [facility]        Emotional Assessment       Orientation: : Oriented to Self,Oriented to Place,Oriented to  Time,Oriented to Situation Alcohol / Substance Use: Not Applicable Psych Involvement: No (comment)  Admission diagnosis:  Acute hypoxemic respiratory failure due to COVID-19 (HCC) [U07.1, J96.01] COVID-19 [U07.1] Patient Active Problem List   Diagnosis Date Noted  . Acute hypoxemic respiratory failure due to COVID-19 [facility]) 10/02/2020   PCP:  Hurshel Party, NP Pharmacy:   [facility] [phone] - [place], [place] - [address] AT [facility] [address] [place] [address] Phone: [phone] Fax: [phone]     Social Determinants of Health (SDOH) Interventions    Readmission Risk Interventions No flowsheet data found.

## 2020-10-15 NOTE — Progress Notes (Signed)
Physical Therapy Treatment Patient Details Name: Joseph Crawford MRN: [id number] DOB: [birth year] Today's Date: 10/15/2020    History of Present Illness 70 y.o. 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 Pt found COVID + Pulse ox 86% on RA. Chest x-ray showed vague patchy airspace opacities in the left base. Admitted 15-Oct-2020 for treatment of COVID. Pt began with confusion 1/22 and head CT negative. Concern for aspiration PNA.    PT Comments    Pt with significant decline in cognitive and functional status since evaluation on 1/21. Pt lethargic and unable to participate in anything other than assisting some with LE ex's. Have updated dc recommendations to SNF.    Follow Up Recommendations  SNF     Equipment Recommendations  Rolling walker with 5" wheels;Hospital bed    Recommendations for Other Services       Precautions / Restrictions Precautions Precautions: None    Mobility  Bed Mobility               General bed mobility comments: Placed bed in chair position to see if could incr arousal. No success so returned bed to prior position.  Transfers                 General transfer comment: Unable to attempt due to lethargy  Ambulation/Gait                 Stairs             Wheelchair Mobility    Modified Rankin (Stroke Patients Only)       Balance                                            Cognition Arousal/Alertness: Lethargic Behavior During Therapy: Flat affect Overall Cognitive Status: Difficult to assess                                 General Comments: Pt not following commands but assisting some with ex's. Answered only  2/10 yes/no questions      Exercises General Exercises - Lower Extremity Long Arc Quad: AAROM;Both;10 reps;Seated (bed in chair position) Heel Slides: AAROM;Both;10 reps;Supine Hip ABduction/ADduction: AAROM;Both;10 reps;Supine    General Comments        Pertinent Vitals/Pain Pain Assessment: Faces Faces Pain Scale: No hurt    Home Living                      Prior Function            PT Goals (current goals can now be found in the care plan section) Acute Rehab PT Goals Patient Stated Goal: get back home Progress towards PT goals: Not progressing toward goals - comment    Frequency    Min 3X/week      PT Plan Discharge plan needs to be updated    Co-evaluation              AM-PAC PT "6 Clicks" Mobility   Outcome Measure  Help needed turning from your back to your  side while in a flat bed without using bedrails?: Total Help needed moving from lying on your back to sitting on the side of a flat bed without using bedrails?: Total Help needed moving to and from a bed to a chair (including a wheelchair)?: Total Help needed standing up from a chair using your arms (e.g., wheelchair or bedside chair)?: Total Help needed to walk in hospital room?: Total Help needed climbing 3-5 steps with a railing? : Total 6 Click Score: 6    End of Session Equipment Utilized During Treatment: Oxygen Activity Tolerance: Patient limited by lethargy Patient left: in bed;with call bell/phone within reach;with bed alarm set   PT Visit Diagnosis: Unsteadiness on feet (R26.81);Other abnormalities of gait and mobility (R26.89);Muscle weakness (generalized) (M62.81);Difficulty in walking, not elsewhere classified (R26.2)     Time: 2197-5883 PT Time Calculation (min) (ACUTE ONLY): 16 min  Charges:  $Therapeutic Activity: 8-22 mins                     [facility] PT Acute Rehabilitation Services Pager [phone] Office [phone]    Angelina Ok  [facility] 10/15/2020, 6:43 PM

## 2020-10-16 ENCOUNTER — Inpatient Hospital Stay (HOSPITAL_COMMUNITY): Payer: Medicare Other

## 2020-10-16 DIAGNOSIS — U071 COVID-19: Secondary | ICD-10-CM | POA: Diagnosis not present

## 2020-10-16 DIAGNOSIS — E870 Hyperosmolality and hypernatremia: Secondary | ICD-10-CM | POA: Diagnosis not present

## 2020-10-16 DIAGNOSIS — N179 Acute kidney failure, unspecified: Secondary | ICD-10-CM | POA: Diagnosis not present

## 2020-10-16 DIAGNOSIS — R410 Disorientation, unspecified: Secondary | ICD-10-CM | POA: Diagnosis not present

## 2020-10-16 LAB — CBC WITH DIFFERENTIAL/PLATELET
Abs Immature Granulocytes: 0.3 10*3/uL — ABNORMAL HIGH (ref 0.00–0.07)
Basophils Absolute: 0 10*3/uL (ref 0.0–0.1)
Basophils Relative: 0 %
Eosinophils Absolute: 0 10*3/uL (ref 0.0–0.5)
Eosinophils Relative: 0 %
HCT: 37.8 % — ABNORMAL LOW (ref 39.0–52.0)
Hemoglobin: 13.1 g/dL (ref 13.0–17.0)
Immature Granulocytes: 4 %
Lymphocytes Relative: 14 %
Lymphs Abs: 1.2 10*3/uL (ref 0.7–4.0)
MCH: 36.3 pg — ABNORMAL HIGH (ref 26.0–34.0)
MCHC: 34.7 g/dL (ref 30.0–36.0)
MCV: 104.7 fL — ABNORMAL HIGH (ref 80.0–100.0)
Monocytes Absolute: 0.3 10*3/uL (ref 0.1–1.0)
Monocytes Relative: 4 %
Neutro Abs: 6.7 10*3/uL (ref 1.7–7.7)
Neutrophils Relative %: 78 %
Platelets: 87 10*3/uL — ABNORMAL LOW (ref 150–400)
RBC: 3.61 MIL/uL — ABNORMAL LOW (ref 4.22–5.81)
RDW: 18 % — ABNORMAL HIGH (ref 11.5–15.5)
WBC: 8.6 10*3/uL (ref 4.0–10.5)
nRBC: 2.5 % — ABNORMAL HIGH (ref 0.0–0.2)

## 2020-10-16 LAB — CULTURE, BLOOD (ROUTINE X 2)
Culture: NO GROWTH
Culture: NO GROWTH
Special Requests: ADEQUATE

## 2020-10-16 LAB — COMPREHENSIVE METABOLIC PANEL
ALT: 27 U/L (ref 0–44)
AST: 23 U/L (ref 15–41)
Albumin: 2.1 g/dL — ABNORMAL LOW (ref 3.5–5.0)
Alkaline Phosphatase: 27 U/L — ABNORMAL LOW (ref 38–126)
Anion gap: 12 (ref 5–15)
BUN: 26 mg/dL — ABNORMAL HIGH (ref 8–23)
CO2: 24 mmol/L (ref 22–32)
Calcium: 8.2 mg/dL — ABNORMAL LOW (ref 8.9–10.3)
Chloride: 112 mmol/L — ABNORMAL HIGH (ref 98–111)
Creatinine, Ser: 1.28 mg/dL — ABNORMAL HIGH (ref 0.61–1.24)
GFR, Estimated: >60 mL/min (ref >60)
Glucose, Bld: 262 mg/dL — ABNORMAL HIGH (ref 70–99)
Potassium: 3.6 mmol/L (ref 3.5–5.1)
Sodium: 148 mmol/L — ABNORMAL HIGH (ref 135–145)
Total Bilirubin: 2.7 mg/dL — ABNORMAL HIGH (ref 0.3–1.2)
Total Protein: 5.8 g/dL — ABNORMAL LOW (ref 6.5–8.1)

## 2020-10-16 LAB — GLUCOSE, CAPILLARY
Glucose-Capillary: 292 mg/dL — ABNORMAL HIGH (ref 70–99)
Glucose-Capillary: 308 mg/dL — ABNORMAL HIGH (ref 70–99)
Glucose-Capillary: 330 mg/dL — ABNORMAL HIGH (ref 70–99)

## 2020-10-16 LAB — T4, FREE: Free T4: 1.03 ng/dL (ref 0.61–1.12)

## 2020-10-16 LAB — C-REACTIVE PROTEIN: CRP: 28.7 mg/dL — ABNORMAL HIGH (ref <1.0)

## 2020-10-16 LAB — TSH: TSH: 0.508 u[IU]/mL (ref 0.350–4.500)

## 2020-10-16 LAB — MAGNESIUM: Magnesium: 2.2 mg/dL (ref 1.7–2.4)

## 2020-10-16 MED ORDER — INSULIN ASPART 100 UNIT/ML ~~LOC~~ SOLN
0.0000 [IU] | SUBCUTANEOUS | Status: DC
  Administered 2020-10-16 (×2): 11 [IU] via SUBCUTANEOUS
  Administered 2020-10-17: 3 [IU] via SUBCUTANEOUS
  Administered 2020-10-17: 5 [IU] via SUBCUTANEOUS
  Administered 2020-10-17: 8 [IU] via SUBCUTANEOUS
  Administered 2020-10-17 (×2): 3 [IU] via SUBCUTANEOUS
  Administered 2020-10-18: 8 [IU] via SUBCUTANEOUS
  Administered 2020-10-18 (×2): 5 [IU] via SUBCUTANEOUS
  Administered 2020-10-18 – 2020-10-19 (×3): 8 [IU] via SUBCUTANEOUS
  Administered 2020-10-19: 3 [IU] via SUBCUTANEOUS
  Administered 2020-10-19: 11 [IU] via SUBCUTANEOUS
  Administered 2020-10-19 (×2): 5 [IU] via SUBCUTANEOUS
  Administered 2020-10-19: 3 [IU] via SUBCUTANEOUS
  Administered 2020-10-20: 8 [IU] via SUBCUTANEOUS
  Administered 2020-10-20: 3 [IU] via SUBCUTANEOUS
  Administered 2020-10-20: 5 [IU] via SUBCUTANEOUS
  Administered 2020-10-20: 8 [IU] via SUBCUTANEOUS
  Administered 2020-10-20: 5 [IU] via SUBCUTANEOUS
  Administered 2020-10-20: 3 [IU] via SUBCUTANEOUS
  Administered 2020-10-21: 8 [IU] via SUBCUTANEOUS
  Administered 2020-10-21: 5 [IU] via SUBCUTANEOUS
  Administered 2020-10-21: 3 [IU] via SUBCUTANEOUS
  Administered 2020-10-21: 5 [IU] via SUBCUTANEOUS
  Administered 2020-10-21 (×2): 8 [IU] via SUBCUTANEOUS
  Administered 2020-10-22: 5 [IU] via SUBCUTANEOUS
  Administered 2020-10-22: 3 [IU] via SUBCUTANEOUS
  Administered 2020-10-22 (×2): 5 [IU] via SUBCUTANEOUS
  Administered 2020-10-22: 8 [IU] via SUBCUTANEOUS
  Administered 2020-10-22: 5 [IU] via SUBCUTANEOUS
  Administered 2020-10-23: 8 [IU] via SUBCUTANEOUS
  Administered 2020-10-23: 5 [IU] via SUBCUTANEOUS
  Administered 2020-10-23 (×2): 3 [IU] via SUBCUTANEOUS
  Administered 2020-10-23: 8 [IU] via SUBCUTANEOUS
  Administered 2020-10-24 (×2): 5 [IU] via SUBCUTANEOUS
  Administered 2020-10-24: 2 [IU] via SUBCUTANEOUS
  Administered 2020-10-24: 5 [IU] via SUBCUTANEOUS
  Administered 2020-10-24 (×2): 3 [IU] via SUBCUTANEOUS
  Administered 2020-10-25: 5 [IU] via SUBCUTANEOUS
  Administered 2020-10-25 (×2): 3 [IU] via SUBCUTANEOUS
  Administered 2020-10-25: 5 [IU] via SUBCUTANEOUS
  Administered 2020-10-25: 2 [IU] via SUBCUTANEOUS
  Administered 2020-10-25: 3 [IU] via SUBCUTANEOUS
  Administered 2020-10-26 (×3): 5 [IU] via SUBCUTANEOUS
  Administered 2020-10-26 – 2020-10-27 (×4): 3 [IU] via SUBCUTANEOUS
  Administered 2020-10-27: 5 [IU] via SUBCUTANEOUS

## 2020-10-16 MED ORDER — METHYLPREDNISOLONE SODIUM SUCC 40 MG IJ SOLR
20.0000 mg | Freq: Every day | INTRAMUSCULAR | Status: DC
  Administered 2020-10-17 – 2020-10-18 (×2): 20 mg via INTRAVENOUS
  Filled 2020-10-16 (×2): qty 1

## 2020-10-16 NOTE — Care Management Important Message (Signed)
Important Message  Patient Details  Name: Joseph Crawford MRN: [id number] Date of Birth: 1951-08-30   Medicare Important Message Given:  Yes     Renie Ora 10/16/2020, 10:56 AM

## 2020-10-16 NOTE — Progress Notes (Addendum)
PROGRESS NOTE  Joseph Crawford [facility]:[id number] DOB: 1950-11-19 DOA: 10/13/2020  PCP: Hurshel Party, NP  Brief History/Interval Summary: 70 y.o. 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  The patient has not been vaccinated against COVID-19.  He tested positive for COVID-19.  Chest x-ray showed patchy airspace disease.  Patient was hospitalized for further management.  Reason for Visit: Pneumonia due to COVID-19.  Acute respiratory failure with hypoxia  Consultants: None  Procedures: None  Antibiotics: Anti-infectives (From admission, onward)   Start     Dose/Rate Route Frequency Ordered Stop   10/15/20 1115  Ampicillin-Sulbactam (UNASYN) 3 g in sodium chloride 0.9 % 100 mL IVPB        3 g 200 mL/hr over 30 Minutes Intravenous Every 6 hours 10/15/20 1017     10/12/20 1000  remdesivir 100 mg in sodium chloride 0.9 % 100 mL IVPB       "Followed by" Linked Group Details   100 mg 200 mL/hr over 30 Minutes Intravenous Daily 09/27/2020 1636 10/15/20 1027   10/15/2020 1900  remdesivir 200 mg in sodium chloride 0.9% 250 mL IVPB       "Followed by" Linked Group Details   200 mg 580 mL/hr over 30 Minutes Intravenous Once 09/23/2020 1636 10/07/2020 2028      Subjective/Interval History: Patient remains encephalopathic this morning.  Does not answer any questions.     Assessment/Plan:  Acute Hypoxic Resp. Failure/Pneumonia due to COVID-19/concern for aspiration pneumonia  Recent Labs  Lab 10/21/2020 1612 10/21/2020 2246 10/12/20 0232 10/13/20 0251 10/14/20 0301 10/15/20 0814 10/16/20 0307  DDIMER 1.48* 1.60* 1.67* 1.28* 1.43* 0.76*  --   FERRITIN 343* 360* 352*  --   --   --   --   CRP 22.7* 25.4* 25.1* 17.8* 14.8*  23.1* 28.7*  ALT 18  --  16 17 18 23 27   PROCALCITON 0.20 0.13  --   --   --   --   --     Objective findings: Fever: Fever curve appears to be improving. Oxygen requirements: Remains on nasal cannula 3 L/min.  Saturations noted in the 90s.  COVID 19 Therapeutics: Antibacterials: Started on Unasyn on 1/24 Remdesivir: Completed course of Remdesivir Steroids:  Solu-Medrol Diuretics: None Inhaled Steroids: None Actemra/Baricitinib: No clear indication as yet PUD Prophylaxis: Noted to be on famotidine DVT Prophylaxis:  Lovenox   Patient tested positive on 1/20.  From a respiratory standpoint patient had been stable and remained stable.  However there was concern for aspiration pneumonia.  From a COVID-19 standpoint he appears to be improving.  He has completed course of Remdesivir.  Remains on steroids.  There is no indication for Actemra or baricitinib.  Lower extremity Doppler studies did not show any DVT.  Dose of steroids were decreased due to encephalopathy.  The treatment plan and use of medications and known side effects were discussed with patient/family. Some of the medications used are based on case reports/anecdotal data.  All other medications being used in the management of COVID-19 based on limited study data.  Complete risks and long-term side effects are unknown, however in the best clinical judgment they seem to be of some benefit.  Patient/family wanted to proceed with treatment options provided.  Fever She did not have any meningeal signs.  Blood cultures are pending.  Repeat UA does not show any infection.  Chest x-ray did raise concern for aspiration.  Patient started on Unasyn.    Acute metabolic encephalopathy/hospital-acquired delirium Patient mentation seems to be stable today compared to yesterday.  Likely multifactorial including fever, acute infection, Covid, electrolyte abnormalities.  Continue antibacterials.  ABG did not show any hypercapnia.  CT scan of the  head done recently did not show any acute changes.    Acute kidney injury/hypernatremia/hypokalemia No recent labs available.  The last creatinine we have in the EMR is from 2015 through [facility] when creatinine was 1.2.  Creatinine was noted to be 2.6 at admission with a BUN of 53.  Thought to be acute kidney injury.   Patient was gently hydrated.  Renal function has improved and almost back to his baseline.  Monitor urine output.  Continue IV fluids since he is unable to take orally. Sodium level finally improving.  Continue D5 infusion.  Potassium is better as well.  Magnesium 2.2.   Diabetes mellitus type 2 Holding metformin.  HbA1c 6.4.  Elevated CBGs due to steroids.  Continue SSI.  Thrombocytopenia Likely due to acute illness.  Drop in platelet counts noted over the last 48 hours.  Lovenox was held.  Continue to monitor.  No evidence for bleeding.    Essential hypertension Monitor blood pressures.  Metoprolol IV as needed.  Sinus tachycardia Likely due to agitation.  Thyroid function tests were normal.  GERD Continue famotidine   DVT Prophylaxis: SCDs Code Status: Full code. CODE STATUS discussed again with daughter today.  She would like to keep him full code for now.  If there is no improvement in the next few days or if he were to get worse they will readdress.  She will need to talk to her 2 other sisters before making any changes. Family Communication: Daughter Larita Fife) being updated daily.   Disposition Plan: Hopefully return home when improved  Status is: Inpatient  Remains inpatient appropriate because:IV treatments appropriate due to intensity of illness or inability to take PO and Inpatient level of care appropriate due to severity of illness   Dispo: The patient is from: Home              Anticipated d/c is to: Home              Anticipated d/c date is: 3 days              Patient currently is not medically stable to d/c.     Medications:   Scheduled: . insulin aspart  0-5 Units Subcutaneous QHS  . insulin aspart  0-9 Units Subcutaneous TID WC  . methylPREDNISolone (SOLU-MEDROL) injection  40 mg Intravenous Daily   Continuous: . ampicillin-sulbactam (UNASYN) IV 3 g (10/16/20 0539)  . dextrose 5 % with KCl 20 mEq / L 20 mEq (10/16/20 0537)   BPZ:WCHENIDPOEUMP, labetalol, [DISCONTINUED] ondansetron **OR** ondansetron (ZOFRAN) IV   Objective:  Vital Signs  Vitals:   10/15/20 1616 10/15/20 2158 10/16/20 0545 10/16/20 0900  BP: 118/68 (!) 145/87 114/77 135/90  Pulse: 68 92 91 (!) 104  Resp: (!) 25 (!) 25 (!) 26 (!) 26  Temp: 97.6 F (36.4 C) 97.8 F (36.6 C) 97.6 F (36.4 C) (!) 100.7 F (38.2 C)  TempSrc: Axillary Axillary Axillary Axillary  SpO2: 96% 96% 96% 96%  Weight:      Height:  Intake/Output Summary (Last 24 hours) at 10/16/2020 1054 Last data filed at 10/15/2020 1810 Gross per 24 hour  Intake -  Output 330 ml  Net -330 ml   Filed Weights   2020/11/05 1625 10/13/20 0457  Weight: 79.4 kg 75.1 kg    General appearance: Remains confused and distracted.  Does not open his eyes. Resp: Mildly tachypneic with coarse breath sounds.  Crackles bilateral bases.  No wheezing or rhonchi. Cardio: S1-S2 is normal regular.  No S3-S4.  No rubs murmurs or bruit GI: Abdomen is soft.  Nontender nondistended.  Bowel sounds are present normal.  No masses organomegaly Extremities: No edema.   Neurologic: Remains disoriented, encephalopathic.  No obvious focal deficits.    Lab Results:  Data Reviewed: I have personally reviewed following labs and imaging studies  CBC: Recent Labs  Lab 10/12/20 0232 10/13/20 0251 10/14/20 0301 10/15/20 0814 10/16/20 0307  WBC 6.8 8.2 8.7 9.6 8.6  NEUTROABS 5.2 6.1 6.4 8.0* 6.7  HGB 12.0* 13.1 13.6 13.1 13.1  HCT 34.0* 38.7* 39.5 36.8* 37.8*  MCV 105.9* 106.0* 105.1* 104.5* 104.7*  PLT 117* 141* 128* 95* 87*    Basic Metabolic Panel: Recent Labs  Lab  10/12/20 0232 10/13/20 0251 10/14/20 0301 10/15/20 0814 10/16/20 0307  NA 146* 150* 151* 150* 148*  K 3.8 3.6 3.1* 3.4* 3.6  CL 109 109 112* 114* 112*  CO2 23 25 24  21* 24  GLUCOSE 198* 167* 144* 277* 262*  BUN 55* 49* 40* 30* 26*  CREATININE 2.02* 1.45* 1.28* 1.34* 1.28*  CALCIUM 8.5* 8.6* 8.8* 8.5* 8.2*  MG  --   --  2.3  --  2.2    GFR: Estimated Creatinine Clearance: 57 mL/min (A) (by C-G formula based on SCr of 1.28 mg/dL (H)).  Liver Function Tests: Recent Labs  Lab 10/12/20 0232 10/13/20 0251 10/14/20 0301 10/15/20 0814 10/16/20 0307  AST 18 19 21 25 23   ALT 16 17 18 23 27   ALKPHOS 20* 20* 22* 26* 27*  BILITOT 1.4* 1.4* 1.7* 2.7* 2.7*  PROT 6.1* 6.3* 6.5 5.8* 5.8*  ALBUMIN 2.6* 2.6* 2.7* 2.4* 2.1*    CBG: Recent Labs  Lab 10/14/20 2048 10/15/20 0907 10/15/20 1203 10/15/20 1615 10/15/20 2212  GLUCAP 179* 266* 268* 231* 288*      Recent Results (from the past 240 hour(s))  SARS Coronavirus 2 by RT PCR (hospital order, performed in [facility] hospital lab) Nasopharyngeal Nasopharyngeal Swab     Status: Abnormal   Collection Time: November 05, 2020  2:03 PM   Specimen: Nasopharyngeal Swab  Result Value Ref Range Status   SARS Coronavirus 2 POSITIVE (A) NEGATIVE Final    Comment: emailed L. Berdik RN 15:10 Nov 05, 2020 ([email or web address]) (NOTE) SARS-CoV-2 target nucleic acids are DETECTED  SARS-CoV-2 RNA is generally detectable in upper respiratory specimens  during the acute phase of infection.  Positive results are indicative  of the presence of the identified virus, but do not rule out bacterial infection or co-infection with other pathogens not detected by the test.  Clinical correlation with patient history and  other diagnostic information is necessary to determine patient infection status.  The expected result is negative.  Fact Sheet for Patients:   [facility]   Fact Sheet for Healthcare Providers:    10/13/20    This test is not yet approved or cleared by the 10/13/20 FDA and  has been authorized for detection and/or diagnosis of SARS-CoV-2 by FDA under an Emergency Use Authorization (EUA).  This  EUA will remain in effect (meaning this test can be used) for the duration of  the  COVID-19 declaration under Section 564(b)(1) of the Act, 21 U.S.C. section 360-bbb-3(b)(1), unless the authorization is terminated or revoked sooner.  Performed at [facility], [address] [place], [place] [address]   Blood Culture (routine x 2)     Status: None (Preliminary result)   Collection Time: 10/13/2020  4:12 PM   Specimen: BLOOD  Result Value Ref Range Status   Specimen Description BLOOD LEFT ANTECUBITAL  Final   Special Requests   Final    BOTTLES DRAWN AEROBIC AND ANAEROBIC Blood Culture results may not be optimal due to an excessive volume of blood received in culture bottles   Culture   Final    NO GROWTH 4 DAYS Performed at [facility], [address] [place], [place] [address]    Report Status PENDING  Incomplete  Blood Culture (routine x 2)     Status: None (Preliminary result)   Collection Time: 10/18/2020  4:15 PM   Specimen: BLOOD LEFT ARM  Result Value Ref Range Status   Specimen Description BLOOD LEFT ARM  Final   Special Requests   Final    BOTTLES DRAWN AEROBIC AND ANAEROBIC Blood Culture adequate volume   Culture   Final    NO GROWTH 4 DAYS Performed at [facility], [address] [place], [place] [address]    Report Status PENDING  Incomplete      Radiology Studies: DG CHEST PORT 1 VIEW  Result Date: 10/15/2020 CLINICAL DATA:  Shortness of breath.  COVID positive EXAM: PORTABLE CHEST 1 VIEW COMPARISON:  Four days ago FINDINGS: Hazy opacity in the left more than right lung. Interface over the bilateral chest attributed to skin folds. Normal heart size and mediastinal contours. CABG. IMPRESSION: Mild pneumonia  asymmetric to the left base. No significant change from 4 days ago. Electronically Signed   By: Marnee SpringJonathon  Watts M.D.   On: 10/15/2020 06:24       LOS: 5 days   Majestic Molony Rito EhrlichKrishnan  [facility] Pager on [email or web address]  10/16/2020, 10:54 AM

## 2020-10-17 DIAGNOSIS — J9601 Acute respiratory failure with hypoxia: Secondary | ICD-10-CM | POA: Diagnosis not present

## 2020-10-17 DIAGNOSIS — U071 COVID-19: Secondary | ICD-10-CM | POA: Diagnosis not present

## 2020-10-17 LAB — BASIC METABOLIC PANEL
Anion gap: 11 (ref 5–15)
Anion gap: 9 (ref 5–15)
BUN: 28 mg/dL — ABNORMAL HIGH (ref 8–23)
BUN: 27 mg/dL — ABNORMAL HIGH (ref 8–23)
CO2: 25 mmol/L (ref 22–32)
CO2: 24 mmol/L (ref 22–32)
Calcium: 8.1 mg/dL — ABNORMAL LOW (ref 8.9–10.3)
Calcium: 8 mg/dL — ABNORMAL LOW (ref 8.9–10.3)
Chloride: 114 mmol/L — ABNORMAL HIGH (ref 98–111)
Chloride: 116 mmol/L — ABNORMAL HIGH (ref 98–111)
Creatinine, Ser: 1.18 mg/dL (ref 0.61–1.24)
Creatinine, Ser: 1.12 mg/dL (ref 0.61–1.24)
GFR, Estimated: >60 mL/min (ref >60)
GFR, Estimated: >60 mL/min (ref >60)
Glucose, Bld: 202 mg/dL — ABNORMAL HIGH (ref 70–99)
Glucose, Bld: 288 mg/dL — ABNORMAL HIGH (ref 70–99)
Potassium: 3.7 mmol/L (ref 3.5–5.1)
Potassium: 4 mmol/L (ref 3.5–5.1)
Sodium: 150 mmol/L — ABNORMAL HIGH (ref 135–145)
Sodium: 149 mmol/L — ABNORMAL HIGH (ref 135–145)

## 2020-10-17 LAB — CBC
HCT: 33.5 % — ABNORMAL LOW (ref 39.0–52.0)
Hemoglobin: 12.3 g/dL — ABNORMAL LOW (ref 13.0–17.0)
MCH: 38.4 pg — ABNORMAL HIGH (ref 26.0–34.0)
MCHC: 36.7 g/dL — ABNORMAL HIGH (ref 30.0–36.0)
MCV: 104.7 fL — ABNORMAL HIGH (ref 80.0–100.0)
Platelets: 90 10*3/uL — ABNORMAL LOW (ref 150–400)
RBC: 3.2 MIL/uL — ABNORMAL LOW (ref 4.22–5.81)
RDW: 18.3 % — ABNORMAL HIGH (ref 11.5–15.5)
WBC: 8.7 10*3/uL (ref 4.0–10.5)
nRBC: 0.9 % — ABNORMAL HIGH (ref 0.0–0.2)

## 2020-10-17 LAB — GLUCOSE, CAPILLARY
Glucose-Capillary: 114 mg/dL — ABNORMAL HIGH (ref 70–99)
Glucose-Capillary: 168 mg/dL — ABNORMAL HIGH (ref 70–99)
Glucose-Capillary: 194 mg/dL — ABNORMAL HIGH (ref 70–99)
Glucose-Capillary: 195 mg/dL — ABNORMAL HIGH (ref 70–99)
Glucose-Capillary: 239 mg/dL — ABNORMAL HIGH (ref 70–99)
Glucose-Capillary: 282 mg/dL — ABNORMAL HIGH (ref 70–99)

## 2020-10-17 LAB — PROTIME-INR
INR: 1.1 (ref 0.8–1.2)
Prothrombin Time: 13.7 seconds (ref 11.4–15.2)

## 2020-10-17 LAB — TYPE AND SCREEN
ABO/RH(D): A POS
Antibody Screen: NEGATIVE

## 2020-10-17 LAB — C-REACTIVE PROTEIN: CRP: 18.2 mg/dL — ABNORMAL HIGH (ref <1.0)

## 2020-10-17 LAB — TECHNOLOGIST SMEAR REVIEW: Plt Morphology: NORMAL

## 2020-10-17 LAB — PLATELET COUNT: Platelets: 83 10*3/uL — ABNORMAL LOW (ref 150–400)

## 2020-10-17 LAB — FOLATE: Folate: 13.8 ng/mL (ref >5.9)

## 2020-10-17 LAB — PROCALCITONIN: Procalcitonin: 0.19 ng/mL

## 2020-10-17 LAB — AMMONIA: Ammonia: 44 umol/L — ABNORMAL HIGH (ref 9–35)

## 2020-10-17 LAB — VITAMIN B12: Vitamin B-12: <50 pg/mL — ABNORMAL LOW (ref 180–914)

## 2020-10-17 LAB — ABO/RH: ABO/RH(D): A POS

## 2020-10-17 LAB — FIBRINOGEN: Fibrinogen: 424 mg/dL (ref 210–475)

## 2020-10-17 LAB — MRSA PCR SCREENING: MRSA by PCR: NEGATIVE

## 2020-10-17 MED ORDER — CYANOCOBALAMIN 1000 MCG/ML IJ SOLN
1000.0000 ug | Freq: Every day | INTRAMUSCULAR | Status: DC
  Administered 2020-10-18 – 2020-10-23 (×6): 1000 ug via SUBCUTANEOUS
  Filled 2020-10-17 (×6): qty 1

## 2020-10-17 MED ORDER — VANCOMYCIN HCL 1500 MG/300ML IV SOLN: 1500.0000 mg | INTRAVENOUS | Status: DC

## 2020-10-17 MED ORDER — SODIUM CHLORIDE 0.9 % IV SOLN
2.0000 g | Freq: Three times a day (TID) | INTRAVENOUS | Status: DC
  Administered 2020-10-17 – 2020-10-19 (×5): 2 g via INTRAVENOUS
  Filled 2020-10-17 (×7): qty 2

## 2020-10-17 MED ORDER — OSMOLITE 1.2 CAL PO LIQD
1000.0000 mL | ORAL | Status: DC
  Filled 2020-10-17: qty 1000

## 2020-10-17 MED ORDER — JEVITY 1.2 CAL PO LIQD
1000.0000 mL | ORAL | Status: DC
  Administered 2020-10-17 – 2020-10-27 (×11): 1000 mL
  Filled 2020-10-17 (×19): qty 1000

## 2020-10-17 MED ORDER — VANCOMYCIN HCL 1500 MG/300ML IV SOLN
1500.0000 mg | Freq: Once | INTRAVENOUS | Status: AC
  Administered 2020-10-17: 1500 mg via INTRAVENOUS
  Filled 2020-10-17: qty 300

## 2020-10-17 MED ORDER — FREE WATER
200.0000 mL | Status: DC
  Administered 2020-10-17 – 2020-10-20 (×16): 200 mL

## 2020-10-17 MED ORDER — FOLIC ACID 1 MG PO TABS
1.0000 mg | ORAL_TABLET | Freq: Every day | ORAL | Status: DC
  Administered 2020-10-17 – 2020-10-26 (×10): 1 mg
  Filled 2020-10-17 (×10): qty 1

## 2020-10-17 NOTE — Progress Notes (Signed)
Physical Therapy Treatment Patient Details Name: Joseph Crawford MRN: [id number] DOB: 1951-01-09 Today's Date: 10/17/2020    History of Present Illness 70 y.o. 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 Pt found COVID + Pulse ox 86% on RA. Chest x-ray showed vague patchy airspace opacities in the left base. Admitted 17-Oct-2020 for treatment of COVID. Pt began with confusion 1/22 and head CT negative. Concern for aspiration PNA.    PT Comments    Pt more alert and able to participate than 1/24 but still very different the presentation on eval on 1/21.    Follow Up Recommendations  SNF     Equipment Recommendations  Rolling walker with 5" wheels;Hospital bed    Recommendations for Other Services       Precautions / Restrictions Precautions Precautions: Fall Restrictions Weight Bearing Restrictions: No    Mobility  Bed Mobility Overal bed mobility: Needs Assistance Bed Mobility: Supine to Sit;Sit to Supine;Rolling     Supine to sit: Max assist;+2 for physical assistance;+2 for safety/equipment Sit to supine: Max assist;+2 for physical assistance;+2 for safety/equipment   General bed mobility comments: max A +2 for all aspects of bed mobility  Transfers Overall transfer level: Needs assistance Equipment used: 2 person hand held assist;Rolling walker (2 wheeled) Transfers: Sit to/from Stand Sit to Stand: Max assist;+2 physical assistance;+2 safety/equipment         General transfer comment: Assist to bring hips up and for balance. Used bed pad under hips to bring up and rocking momentum.  Ambulation/Gait             General Gait Details: Unable to take any steps   Stairs              Wheelchair Mobility    Modified Rankin (Stroke Patients Only)       Balance Overall balance assessment: Needs assistance Sitting-balance support: Bilateral upper extremity supported;Feet supported Sitting balance-Leahy Scale: Poor Sitting balance - Comments: able to progress to close min guard, kyphotic making it appear he leans forward   Standing balance support: Bilateral upper extremity supported Standing balance-Leahy Scale: Poor Standing balance comment: walker and +2 mod assist for static standing x 15 sec                            Cognition Arousal/Alertness: Awake/alert;Lethargic Behavior During Therapy: Flat affect Overall Cognitive Status: Impaired/Different from baseline Area of Impairment: Following commands;Safety/judgement;Awareness;Problem solving                       Following Commands: Follows one step commands inconsistently;Follows one step commands with increased time Safety/Judgement: Decreased awareness of safety;Decreased awareness of deficits Awareness: Intellectual Problem Solving: Slow processing;Decreased initiation;Difficulty sequencing;Requires verbal cues;Requires tactile cues General Comments: following commands about 50% of the time with increased time for processing      Exercises      General Comments General comments (skin integrity, edema, etc.): Pt on 3L via [place] throughout session, SpO2 >93% throughout session      Pertinent Vitals/Pain Pain Assessment: Faces Faces Pain Scale: No hurt Pain Intervention(s): Monitored during session;Repositioned    Home Living Family/patient expects to be discharged to:: Private residence Living Arrangements:  Children Available Help at Discharge: Family;Available PRN/intermittently Type of Home: Apartment Home Access: Level entry   Home Layout: One level Home Equipment: Walker - 2 wheels Additional Comments: information gleaned from Pt evaluation prior to cognition  deficits    Prior Function Level of Independence: Independent with assistive device(s)      Comments: ambulates with RW, independent with bird baths, and dressing, takes care of the cooking and cleaning, goes with daughter shopping   PT Goals (current goals can now be found in the care plan section) Acute Rehab PT Goals Patient Stated Goal: none stated PT Goal Formulation: Patient unable to participate in goal setting Time For Goal Achievement: 10/31/20 Potential to Achieve Goals: Fair Progress towards PT goals: Goals downgraded-see care plan    Frequency    Min 3X/week      PT Plan Current plan remains appropriate    Co-evaluation PT/OT/SLP Co-Evaluation/Treatment: Yes Reason for Co-Treatment: Complexity of the patient's impairments (multi-system involvement);For patient/therapist safety PT goals addressed during session: Mobility/safety with mobility        AM-PAC PT "6 Clicks" Mobility   Outcome Measure  Help needed turning from your back to your side while in a flat bed without using bedrails?: Total Help needed moving from lying on your back to sitting on the side of a flat bed without using bedrails?: Total Help needed moving to and from a bed to a chair (including a wheelchair)?: Total Help needed standing up from a chair using your arms (e.g., wheelchair or bedside chair)?: A Lot Help needed to walk in hospital room?: Total Help needed climbing 3-5 steps with a railing? : Total 6 Click Score: 7    End of Session Equipment Utilized During Treatment: Oxygen Activity Tolerance: Patient limited by fatigue Patient left: in bed;with call bell/phone within reach;with bed alarm set Nurse Communication: Mobility status PT Visit Diagnosis: Unsteadiness on feet (R26.81);Other abnormalities of gait and mobility (R26.89);Muscle weakness (generalized) (M62.81)     Time: 8841-6606 PT Time Calculation (min) (ACUTE ONLY): 26 min  Charges:  $Therapeutic Activity: 8-22  mins                     [facility] PT Acute Rehabilitation Services Pager [phone] Office [phone]    Angelina Ok [address] [facility] 10/17/2020, 11:57 AM

## 2020-10-17 NOTE — Progress Notes (Addendum)
Pharmacy Antibiotic Note  Joseph Crawford is a 70 y.o. male admitted on 10/05/2020 with worsening shortness of breath.  Pharmacy has been consulted for unasyn dosing for possible aspiration pneumonia now broadening antibiotics to vancomycin and ceftazidime  Decreased responsiveness this morning, febrile to 101.4 this morning, wbc within normal limits at 8. Renal function within normal limits.   Vancomycin 1500 mg IV Q 24 hrs. Goal AUC 400-550. Expected AUC: 490 SCr used: 1.18  Plan: Vancomycin 2g IV now then 1500mg  IV q24 hours Fortaz 2g q8 hours  Height: 6' (182.9 cm) Weight: 74 kg (163 lb 2.3 oz) IBW/kg (Calculated) : 77.6  Temp (24hrs), Avg:98.5 F (36.9 C), Min:97.5 F (36.4 C), Max:101.4 F (38.6 C)  Recent Labs  Lab 10/05/2020 1532 10/21/2020 1612 10/13/20 0251 10/14/20 0301 10/15/20 0814 10/16/20 0307 10/17/20 0158  WBC  --    < > 8.2 8.7 9.6 8.6 8.7  CREATININE  --    < > 1.45* 1.28* 1.34* 1.28* 1.18  LATICACIDVEN 1.2  --   --   --   --   --   --    < > = values in this interval not displayed.    Estimated Creatinine Clearance: 61 mL/min (by C-G formula based on SCr of 1.18 mg/dL).    Allergies  Allergen Reactions  . Statins Other (See Comments)    Per pt: muscle loss and weakness      Thank you for allowing pharmacy to be a part of this patient's care.  10/19/20 PharmD., BCPS Clinical Pharmacist 10/17/2020 1:48 PM

## 2020-10-17 NOTE — Procedures (Signed)
Cortrak  Person Inserting Tube:  King, [name], RD Tube Type:  Cortrak - 43 inches Tube Location:  Right nare Initial Placement:  Stomach Secured by: Bridle Technique Used to Measure Tube Placement:  Documented cm marking at nare/ corner of mouth Cortrak Secured At:  70 cm    Cortrak Tube Team Note:  Consult received to place a Cortrak feeding tube.   No x-ray is required. RN may begin using tube.   If the tube becomes dislodged please keep the tube and contact the Cortrak team at [email or web address] (password [email or web address]) for replacement.  If after hours and replacement cannot be delayed, place a NG tube and confirm placement with an abdominal x-ray.   Meera Vasco King, MS, RD, LDN Pager number available on Amion 

## 2020-10-17 NOTE — Progress Notes (Signed)
Initial Nutrition Assessment  DOCUMENTATION CODES:   Not applicable  INTERVENTION:   Initiate tube feeding via Cortrak: Jevity 1.2 at 25 ml/h, increase by 10 ml every 4 hours to goal rate of 75 ml/h (1800 ml per day)  Provides 2160 kcal, 100 gm protein, 1458 ml free water daily  NUTRITION DIAGNOSIS:   Increased nutrient needs related to catabolic illness (COVID-19) as evidenced by estimated needs.  GOAL:   Patient will meet greater than or equal to 90% of their needs  MONITOR:   TF tolerance,Labs,Diet advancement  REASON FOR ASSESSMENT:   Consult Enteral/tube feeding initiation and management  ASSESSMENT:   70 yo male admitted with worsening dyspnea, fever, and chills; tested positive for COVID-19. PMH includes DM-2, CAD, HLD, HTN, tobacco abuse.   Spoke with patient's RN today who reports patient is only able to tell her his first name and that he is very lethargic today.  S/P swallow evaluation with SLP 1/24 and was made NPO due to severe aspiration risk.  Patient was on a heart healthy diet from 1/20-1/23. Downgraded to dysphagia 3 with thin liquids 1/23. NPO 1/24.   Cortrak tube placement has been ordered, can hopefully be placed today. Received MD Consult for TF initiation and management.  Labs reviewed. Sodium 150 CBG: [phone]  Medications reviewed and include Novolog, solu-medrol. IVF: D5 with KCl at 100 ml/h  No weight history available for review. Admission weight 79.4 kg Currently 74 kg  NUTRITION - FOCUSED PHYSICAL EXAM:  unable to complete  Diet Order:   Diet Order            Diet NPO time specified  Diet effective now                 EDUCATION NEEDS:   Not appropriate for education at this time  Skin:  Skin Assessment: Reviewed RN Assessment  Last BM:  1/23 type 4  Height:   Ht Readings from Last 1 Encounters:  10/14/2020 6' (1.829 m)    Weight:   Wt Readings from Last 1 Encounters:  10/17/20 74 kg    Ideal Body  Weight:  80.9 kg  BMI:  Body mass index is 22.13 kg/m.  Estimated Nutritional Needs:   Kcal:  2100-2300  Protein:  95-120 gm  Fluid:  >/= 2.2 L    Gabriel Rainwater, RD, LDN, CNSC Please refer to Amion for contact information.

## 2020-10-17 NOTE — Progress Notes (Signed)
PROGRESS NOTE  Joseph Crawford IWL:[id number] DOB: 04-30-1951 DOA: 10/04/2020  PCP: Hurshel Party, NP  Brief History/Interval Summary: 70 y.o. 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  The patient has not been vaccinated against COVID-19.  He tested positive for COVID-19.  Chest x-ray showed patchy airspace disease.  Patient was hospitalized for further management.  Reason for Visit: Pneumonia due to COVID-19.  Acute respiratory failure with hypoxia  Consultants: None  Procedures: None  Antibiotics: Anti-infectives (From admission, onward)   Start     Dose/Rate Route Frequency Ordered Stop   10/18/20 1600  vancomycin (VANCOREADY) IVPB 1500 mg/300 mL        1,500 mg 150 mL/hr over 120 Minutes Intravenous Every 24 hours 10/17/20 1402     10/17/20 1400  vancomycin (VANCOREADY) IVPB 1500 mg/300 mL        1,500 mg 150 mL/hr over 120 Minutes Intravenous  Once 10/17/20 1153 10/17/20 1711   10/17/20 1300  cefTAZidime (FORTAZ) 2 g in sodium chloride 0.9 % 100 mL IVPB        2 g 200 mL/hr over 30 Minutes Intravenous Every 8 hours 10/17/20 1153     10/15/20 1115  Ampicillin-Sulbactam (UNASYN) 3 g in sodium chloride 0.9 % 100 mL IVPB  Status:  Discontinued        3 g 200 mL/hr over 30 Minutes Intravenous Every 6 hours 10/15/20 1017 10/17/20 1153   10/12/20 1000  remdesivir 100 mg in sodium chloride 0.9 % 100 mL IVPB       "Followed by" Linked Group Details   100 mg 200 mL/hr over 30 Minutes Intravenous Daily 10/14/2020 1636 10/15/20 1027   10/18/2020 1900  remdesivir 200 mg in sodium chloride 0.9% 250 mL IVPB       "Followed by" Linked Group Details   200 mg 580 mL/hr over 30 Minutes Intravenous Once 10/20/2020 1636  09/22/2020 2028      Subjective/Interval History: Remains confused.  Is able to repeat my name but unable to tell me his name.  No nausea no vomiting.  No acute events overnight.  Minimal oral intake.   Assessment/Plan:  Acute Hypoxic Resp. Failure/Pneumonia due to COVID-19/concern for aspiration pneumonia  Recent Labs  Lab 10/03/2020 1612 10/20/2020 2246 10/12/20 0232 10/13/20 0251 10/14/20 0301 10/15/20 0814 10/16/20 0307 10/17/20 0158 10/17/20 1226  DDIMER 1.48* 1.60* 1.67* 1.28* 1.43* 0.76*  --   --   --   FERRITIN 343* 360* 352*  --   --   --   --   --   --   CRP 22.7* 25.4* 25.1* 17.8* 14.8* 23.1* 28.7*  --  18.2*  ALT 18  --  16 17 18 23 27   --   --   PROCALCITON 0.20 0.13  --   --   --   --   --  0.19  --     Objective findings: Fever: Fever curve appears to be improving. Oxygen requirements: Remains on nasal cannula 3 L/min.  Saturations noted in the 90s.  COVID 19 Therapeutics: Antibacterials: Started on Unasyn on 1/24 Remdesivir: Completed course of Remdesivir Steroids:  Solu-Medrol Diuretics: None Inhaled Steroids: None Actemra/Baricitinib: No clear indication as yet PUD Prophylaxis: Noted to be on  famotidine DVT Prophylaxis: SCD  Patient tested positive on 1/20.  From a respiratory standpoint patient had been stable and remained stable.  However there was concern for aspiration pneumonia.  From a COVID-19 standpoint he appears to be improving.  He has completed course of Remdesivir.  Remains on steroids.  There is no indication for Actemra or baricitinib.  Lower extremity Doppler studies did not show any DVT.  Dose of steroids were decreased due to encephalopathy.  The treatment plan and use of medications and known side effects were discussed with patient/family. Some of the medications used are based on case reports/anecdotal data.  All other medications being used in the management of COVID-19 based on limited study data.  Complete risks and long-term side  effects are unknown, however in the best clinical judgment they seem to be of some benefit.  Patient/family wanted to proceed with treatment options provided.  Fever She did not have any meningeal signs.  Blood cultures are pending.  Repeat UA does not show any infection.  Chest x-ray did raise concern for aspiration.  Patient started on Unasyn.    Acute metabolic encephalopathy/hospital-acquired delirium Severe B12 deficiency Patient mentation seems to be stable today compared to yesterday.  Likely multifactorial including fever, acute infection, Covid, electrolyte abnormalities.  Continue antibacterials.  ABG did not show any hypercapnia.  CT scan of the head done recently did not show any acute changes. Further metabolic work-up initiated which showed severe B12 deficiency. Will initiate subcutaneous replacement.  Acute kidney injury/hypernatremia/hypokalemia No recent labs available.  The last creatinine we have in the EMR is from 2015 through [facility] when creatinine was 1.2.  Creatinine was noted to be 2.6 at admission with a BUN of 53.  Thought to be acute kidney injury.   Patient was gently hydrated.  Renal function has improved and almost back to his baseline.  Monitor urine output.  Continue IV fluids since he is unable to take orally. Sodium level remains elevated. Will initiate core track and free water.  Monitor sodium  Diabetes mellitus type 2 Holding metformin.  HbA1c 6.4.  Elevated CBGs due to steroids.  Continue SSI.  Thrombocytopenia Likely due to acute illness. Monitor for active bleeding.  Essential hypertension Monitor blood pressures.  Metoprolol IV as needed.  Sinus tachycardia Likely due to agitation.  Thyroid function tests were normal.  GERD Continue famotidine  Goals of care conversation. Limited code based on my conversation. We would like to continue to treat what is treatable condition. Also like to continue with feeding tube insertion for  now.  DVT Prophylaxis: SCDs Code Status: Limited code based on my discussion with both daughter on the phone. Family Communication: Daughter Larita Fife(Lynn) being updated daily.   Disposition Plan: to be determined   Status is: Inpatient  Remains inpatient appropriate because:IV treatments appropriate due to intensity of illness or inability to take PO and Inpatient level of care appropriate due to severity of illness   Dispo: The patient is from: Home              Anticipated d/c is to: Home              Anticipated d/c date is: 3 days              Patient currently is not medically stable to d/c.     Medications:  Scheduled: . [START ON 10/18/2020] cyanocobalamin  1,000 mcg Subcutaneous Q1200  . folic acid  1 mg Per Tube Daily  .  free water  200 mL Per Tube Q4H  . insulin aspart  0-15 Units Subcutaneous Q4H  . methylPREDNISolone (SOLU-MEDROL) injection  20 mg Intravenous Daily   Continuous: . cefTAZidime (FORTAZ)  IV 2 g (10/17/20 1328)  . dextrose 5 % with KCl 20 mEq / L 20 mEq (10/16/20 1745)  . feeding supplement (JEVITY 1.2 CAL) 1,000 mL (10/17/20 1524)  . [START ON 10/18/2020] vancomycin     ZOX:[id number], labetalol, [DISCONTINUED] ondansetron **OR** ondansetron (ZOFRAN) IV   Objective:  Vital Signs  Vitals:   10/17/20 0411 10/17/20 0846 10/17/20 1033 10/17/20 1236  BP: 122/78 133/81 115/74 (!) 144/79  Pulse: 63 (!) 23 98 96  Resp: (!) 24 (!) 22 (!) 26 (!) 22  Temp: 98.4 F (36.9 C) (!) 97.5 F (36.4 C)  98 F (36.7 C)  TempSrc: Axillary Axillary  Oral  SpO2: 96% 100% 92% 92%  Weight: 74 kg     Height:        Intake/Output Summary (Last 24 hours) at 10/17/2020 2000 Last data filed at 10/17/2020 0514 Gross per 24 hour  Intake 1300 ml  Output 625 ml  Net 675 ml   Filed Weights   10/14/2020 1625 10/13/20 0457 10/17/20 0411  Weight: 79.4 kg 75.1 kg 74 kg    General: Appear in moderate distress, no Rash; Oral Mucosa Clear, dry. no Abnormal Neck Mass Or  lumps, Conjunctiva normal  Cardiovascular: S1 and S2 Present, no Murmur, Respiratory: increased respiratory effort, Bilateral Air entry present and bilateral  Crackles, no wheezes Abdomen: Bowel Sound present, Soft and no tenderness Extremities: trace Pedal edema Neurology: alert and not oriented to time, place, and person affect appropriate. no new focal deficit Gait not checked due to patient safety concerns      Lab Results:  Data Reviewed: I have personally reviewed following labs and imaging studies  CBC: Recent Labs  Lab 10/12/20 0232 10/13/20 0251 10/14/20 0301 10/15/20 0814 10/16/20 0307 10/17/20 0158 10/17/20 1226  WBC 6.8 8.2 8.7 9.6 8.6 8.7  --   NEUTROABS 5.2 6.1 6.4 8.0* 6.7  --   --   HGB 12.0* 13.1 13.6 13.1 13.1 12.3*  --   HCT 34.0* 38.7* 39.5 36.8* 37.8* 33.5*  --   MCV 105.9* 106.0* 105.1* 104.5* 104.7* 104.7*  --   PLT 117* 141* 128* 95* 87* 90* 83*    Basic Metabolic Panel: Recent Labs  Lab 10/14/20 0301 10/15/20 0814 10/16/20 0307 10/17/20 0158 10/17/20 1852  NA 151* 150* 148* 150* 149*  K 3.1* 3.4* 3.6 3.7 4.0  CL 112* 114* 112* 114* 116*  CO2 24 21* 24 25 24   GLUCOSE 144* 277* 262* 202* 288*  BUN 40* 30* 26* 28* 27*  CREATININE 1.28* 1.34* 1.28* 1.18 1.12  CALCIUM 8.8* 8.5* 8.2* 8.1* 8.0*  MG 2.3  --  2.2  --   --     GFR: Estimated Creatinine Clearance: 64.2 mL/min (by C-G formula based on SCr of 1.12 mg/dL).  Liver Function Tests: Recent Labs  Lab 10/12/20 0232 10/13/20 0251 10/14/20 0301 10/15/20 0814 10/16/20 0307  AST 18 19 21 25 23   ALT 16 17 18 23 27   ALKPHOS 20* 20* 22* 26* 27*  BILITOT 1.4* 1.4* 1.7* 2.7* 2.7*  PROT 6.1* 6.3* 6.5 5.8* 5.8*  ALBUMIN 2.6* 2.6* 2.7* 2.4* 2.1*    CBG: Recent Labs  Lab 10/17/20 0017 10/17/20 0410 10/17/20 0846 10/17/20 1234 10/17/20 1523  GLUCAP 239* 114* 168* 195* 194*  Recent Results (from the past 240 hour(s))  SARS Coronavirus 2 by RT PCR (hospital order,  performed in [facility] hospital lab) Nasopharyngeal Nasopharyngeal Swab     Status: Abnormal   Collection Time: 10/10/2020  2:03 PM   Specimen: Nasopharyngeal Swab  Result Value Ref Range Status   SARS Coronavirus 2 POSITIVE (A) NEGATIVE Final    Comment: emailed L. Berdik RN 15:10 10/19/2020 ([email or web address]) (NOTE) SARS-CoV-2 target nucleic acids are DETECTED  SARS-CoV-2 RNA is generally detectable in upper respiratory specimens  during the acute phase of infection.  Positive results are indicative  of the presence of the identified virus, but do not rule out bacterial infection or co-infection with other pathogens not detected by the test.  Clinical correlation with patient history and  other diagnostic information is necessary to determine patient infection status.  The expected result is negative.  Fact Sheet for Patients:   BoilerBrush.com.cy   Fact Sheet for Healthcare Providers:   https://pope.com/    This test is not yet approved or cleared by the [place] FDA and  has been authorized for detection and/or diagnosis of SARS-CoV-2 by FDA under an Emergency Use Authorization (EUA).  This EUA will remain in effect (meaning this test can be used) for the duration of  the  COVID-19 declaration under Section 564(b)(1) of the Act, 21 U.S.C. section 360-bbb-3(b)(1), unless the authorization is terminated or revoked sooner.  Performed at [facility], [address] [place], [place] [address]   Blood Culture (routine x 2)     Status: None   Collection Time: 09/29/2020  4:12 PM   Specimen: BLOOD  Result Value Ref Range Status   Specimen Description BLOOD LEFT ANTECUBITAL  Final   Special Requests   Final    BOTTLES DRAWN AEROBIC AND ANAEROBIC Blood Culture results may not be optimal due to an excessive volume of blood received in culture bottles   Culture   Final    NO GROWTH 5 DAYS Performed at [facility], [address], [place], [place] [address]    Report Status 10/16/2020 FINAL  Final  Blood Culture (routine x 2)     Status: None   Collection Time: 10/06/2020  4:15 PM   Specimen: BLOOD LEFT ARM  Result Value Ref Range Status   Specimen Description BLOOD LEFT ARM  Final   Special Requests   Final    BOTTLES DRAWN AEROBIC AND ANAEROBIC Blood Culture adequate volume   Culture   Final    NO GROWTH 5 DAYS Performed at [facility], [address] [place], [place] [address]    Report Status 10/16/2020 FINAL  Final  Culture, blood (Routine X 2) w Reflex to ID Panel     Status: None (Preliminary result)   Collection Time: 10/15/20 11:34 AM   Specimen: BLOOD RIGHT HAND  Result Value Ref Range Status   Specimen Description BLOOD RIGHT HAND  Final   Special Requests   Final    BOTTLES DRAWN AEROBIC ONLY Blood Culture adequate volume   Culture   Final    NO GROWTH 2 DAYS Performed at [facility], [address] [place], [place] [address]    Report Status PENDING  Incomplete  Culture, blood (Routine X 2) w Reflex to ID Panel     Status: None (Preliminary result)   Collection Time: 10/15/20 11:34 AM   Specimen: BLOOD RIGHT HAND  Result Value Ref Range Status   Specimen  Description BLOOD RIGHT HAND  Final   Special Requests   Final    BOTTLES DRAWN AEROBIC ONLY Blood Culture adequate volume   Culture   Final    NO GROWTH 2 DAYS Performed at [facility], [address], [place], [place] [address]    Report Status PENDING  Incomplete      Radiology Studies: CT HEAD WO CONTRAST  Result Date: 10/16/2020 CLINICAL DATA:  Altered mental status.  Confusion. EXAM: CT HEAD WITHOUT CONTRAST TECHNIQUE: Contiguous axial images were obtained from the base of the skull through the vertex without intravenous contrast. COMPARISON:  10/13/2020 FINDINGS: Brain: Generalized age related atrophy. Mild chronic small-vessel change of the white matter. No sign of acute infarction, mass lesion,  hemorrhage, hydrocephalus or extra-axial collection. Vascular: There is atherosclerotic calcification of the major vessels at the base of the brain. Skull: Negative Sinuses/Orbits: Clear/normal Other: None IMPRESSION: No acute finding by CT. Age related atrophy and mild chronic small-vessel change of the white matter. Electronically Signed   By: Paulina Fusi M.D.   On: 10/16/2020 14:59       LOS: 6 days   Lynden Oxford  [facility] Pager on [email or web address]  10/17/2020, 8:00 PM

## 2020-10-17 NOTE — Evaluation (Signed)
Occupational Therapy Evaluation Patient Details Name: Joseph Crawford MRN: [id number] DOB: [birth year] Today's Date: 10/17/2020    History of Present Illness 69 y.o. 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 Pt found COVID + Pulse ox 86% on RA. Chest x-ray showed vague patchy airspace opacities in the left base. Admitted October 21, 2020 for treatment of COVID. Pt began with confusion 1/22 and head CT negative. Concern for aspiration PNA.   Clinical Impression   Pt with cognitive deficits this session. Pt original PT eval Pt was mod I at home with use of DME and RW - daughter assist with IADL. Pt on 3L O2 throughout session and SpO2 >93% throughout session. Pt with eyes largely closed throughout session, following commands 50% of the time with increased time to process. At this time BUE generally 3-/5 and unable to bring hands to face for grooming/self-feeding. Pt is currently total A for ADL. At this time recommending SNF/LTACH post acute to maximize safety and independence in ADL and functional transfers as well as continued OT services acutely.   Follow Up Recommendations  SNF;LTACH    Equipment Recommendations  Other (comment) (defer to next venue)    Recommendations for Other Services       Precautions / Restrictions Precautions Precautions: Fall Restrictions Weight Bearing Restrictions: No      Mobility Bed Mobility Overal bed mobility: Needs Assistance Bed Mobility: Supine to Sit;Sit to Supine;Rolling     Supine to sit: Max assist;+2 for physical assistance;+2 for safety/equipment Sit to supine: Max assist;+2 for physical assistance;+2 for safety/equipment   General bed mobility comments: max A +2 for all  aspects of bed mobility    Transfers Overall transfer level: Needs assistance Equipment used: 2 person hand held assist;Rolling walker (2 wheeled) Transfers: Sit to/from Stand Sit to Stand: Max assist;+2 physical assistance;+2 safety/equipment         General transfer comment: rocking momentum used to    Balance Overall balance assessment: Needs assistance Sitting-balance support: Bilateral upper extremity supported;Feet supported Sitting balance-Leahy Scale: Poor Sitting balance - Comments: able to progress to close min guard, kyphotic making it appear he leans forward   Standing balance support: Bilateral upper extremity supported Standing balance-Leahy Scale: Poor                             ADL either performed or assessed with clinical judgement   ADL Overall ADL's : Needs assistance/impaired                                       General ADL Comments: total A for ADL at this time.     Vision   Vision Assessment?: Vision impaired- to be further tested in functional context Additional Comments: continue to assess - Pt would not really open eyes during session - but did raise eye lids     Perception     Praxis      Pertinent Vitals/Pain Pain Assessment: Faces Faces Pain Scale: No hurt Pain Intervention(s): Monitored during session;Repositioned     Hand Dominance     Extremity/Trunk Assessment Upper Extremity Assessment Upper Extremity Assessment:  Generalized weakness;Difficult to assess due to impaired cognition (R>L with weakness and following commands)   Lower Extremity Assessment Lower Extremity Assessment: Defer to PT evaluation       Communication Communication Communication: Expressive difficulties;Other (comment) (very mumbly and often siging/humming tunes)   Cognition Arousal/Alertness: Awake/alert;Lethargic Behavior During Therapy: Flat affect Overall Cognitive Status: Impaired/Different from baseline Area of  Impairment: Following commands;Safety/judgement;Awareness;Problem solving                       Following Commands: Follows one step commands inconsistently;Follows one step commands with increased time Safety/Judgement: Decreased awareness of safety;Decreased awareness of deficits Awareness: Intellectual Problem Solving: Slow processing;Decreased initiation;Difficulty sequencing;Requires verbal cues;Requires tactile cues General Comments: following commands about 50% of the time with increased time for processing   General Comments  Pt on 3L via [place] throughout session, SpO2 >93% throughout session    Exercises     Shoulder Instructions      Home Living Family/patient expects to be discharged to:: Private residence Living Arrangements: Children Available Help at Discharge: Family;Available PRN/intermittently Type of Home: Apartment Home Access: Level entry     Home Layout: One level     Bathroom Shower/Tub: Chief Strategy Officer: Standard Bathroom Accessibility: Yes   Home Equipment: Environmental consultant - 2 wheels   Additional Comments: information gleaned from Pt evaluation prior to cognition deficits      Prior Functioning/Environment Level of Independence: Independent with assistive device(s)        Comments: ambulates with RW, independent with bird baths, and dressing, takes care of the cooking and cleaning, goes with daughter shopping        OT Problem List: Decreased strength;Decreased range of motion;Decreased activity tolerance;Impaired balance (sitting and/or standing);Impaired vision/perception;Decreased cognition;Decreased safety awareness;Decreased knowledge of precautions;Decreased knowledge of use of DME or AE;Impaired UE functional use      OT Treatment/Interventions: Self-care/ADL training;Energy conservation;DME and/or AE instruction;Manual therapy;Therapeutic activities;Cognitive remediation/compensation;Visual/perceptual  remediation/compensation;Patient/family education;Balance training    OT Goals(Current goals can be found in the care plan section) Acute Rehab OT Goals Patient Stated Goal: none stated OT Goal Formulation: Patient unable to participate in goal setting Time For Goal Achievement: 10/31/20 Potential to Achieve Goals: Fair  OT Frequency: Min 2X/week   Barriers to D/C:            Co-evaluation              AM-PAC OT "6 Clicks" Daily Activity     Outcome Measure Help from another person eating meals?: Total (NPO) Help from another person taking care of personal grooming?: Total Help from another person toileting, which includes using toliet, bedpan, or urinal?: Total Help from another person bathing (including washing, rinsing, drying)?: Total Help from another person to put on and taking off regular upper body clothing?: Total Help from another person to put on and taking off regular lower body clothing?: Total 6 Click Score: 6   End of Session Equipment Utilized During Treatment: Rolling walker;Oxygen (3L) Nurse Communication: Mobility status  Activity Tolerance: Patient tolerated treatment well Patient left: in bed;with call bell/phone within reach;with bed alarm set  OT Visit Diagnosis: Unsteadiness on feet (R26.81);Other abnormalities of gait and mobility (R26.89);Muscle weakness (generalized) (M62.81);Other symptoms and signs involving cognitive function                Time: 1448-1856 OT Time Calculation (min): 28 min Charges:  OT General Charges $OT Visit: 1 Visit OT Evaluation $OT Eval Moderate Complexity: 1  Mod  Nyoka Cowden OTR/L Acute Rehabilitation Services Pager: [phone] Office: [phone]  Evern Bio Cinque Begley 10/17/2020, 11:35 AM

## 2020-10-18 DIAGNOSIS — J9601 Acute respiratory failure with hypoxia: Secondary | ICD-10-CM | POA: Diagnosis not present

## 2020-10-18 DIAGNOSIS — U071 COVID-19: Secondary | ICD-10-CM | POA: Diagnosis not present

## 2020-10-18 LAB — GLUCOSE, CAPILLARY
Glucose-Capillary: 240 mg/dL — ABNORMAL HIGH (ref 70–99)
Glucose-Capillary: 248 mg/dL — ABNORMAL HIGH (ref 70–99)
Glucose-Capillary: 207 mg/dL — ABNORMAL HIGH (ref 70–99)
Glucose-Capillary: 261 mg/dL — ABNORMAL HIGH (ref 70–99)
Glucose-Capillary: 251 mg/dL — ABNORMAL HIGH (ref 70–99)

## 2020-10-18 LAB — BASIC METABOLIC PANEL
Anion gap: 9 (ref 5–15)
BUN: 28 mg/dL — ABNORMAL HIGH (ref 8–23)
CO2: 26 mmol/L (ref 22–32)
Calcium: 8.2 mg/dL — ABNORMAL LOW (ref 8.9–10.3)
Chloride: 112 mmol/L — ABNORMAL HIGH (ref 98–111)
Creatinine, Ser: 1.01 mg/dL (ref 0.61–1.24)
GFR, Estimated: >60 mL/min (ref >60)
Glucose, Bld: 274 mg/dL — ABNORMAL HIGH (ref 70–99)
Potassium: 3.7 mmol/L (ref 3.5–5.1)
Sodium: 147 mmol/L — ABNORMAL HIGH (ref 135–145)

## 2020-10-18 LAB — CBC
HCT: 33.2 % — ABNORMAL LOW (ref 39.0–52.0)
Hemoglobin: 12 g/dL — ABNORMAL LOW (ref 13.0–17.0)
MCH: 38.7 pg — ABNORMAL HIGH (ref 26.0–34.0)
MCHC: 36.1 g/dL — ABNORMAL HIGH (ref 30.0–36.0)
MCV: 107.1 fL — ABNORMAL HIGH (ref 80.0–100.0)
Platelets: 88 10*3/uL — ABNORMAL LOW (ref 150–400)
RBC: 3.1 MIL/uL — ABNORMAL LOW (ref 4.22–5.81)
RDW: 18.6 % — ABNORMAL HIGH (ref 11.5–15.5)
WBC: 11.1 10*3/uL — ABNORMAL HIGH (ref 4.0–10.5)
nRBC: 1 % — ABNORMAL HIGH (ref 0.0–0.2)

## 2020-10-18 LAB — PROCALCITONIN: Procalcitonin: 0.11 ng/mL

## 2020-10-18 MED ORDER — ADULT MULTIVITAMIN LIQUID CH
15.0000 mL | Freq: Every day | ORAL | Status: DC
  Administered 2020-10-18 – 2020-10-26 (×9): 15 mL
  Filled 2020-10-18 (×9): qty 15

## 2020-10-18 MED ORDER — ORAL CARE MOUTH RINSE
15.0000 mL | Freq: Two times a day (BID) | OROMUCOSAL | Status: DC
  Administered 2020-10-18 – 2020-10-27 (×18): 15 mL via OROMUCOSAL

## 2020-10-18 MED ORDER — INSULIN DETEMIR 100 UNIT/ML ~~LOC~~ SOLN
5.0000 [IU] | Freq: Every day | SUBCUTANEOUS | Status: DC
  Administered 2020-10-18: 5 [IU] via SUBCUTANEOUS
  Filled 2020-10-18 (×2): qty 0.05

## 2020-10-18 MED ORDER — THIAMINE HCL 100 MG PO TABS
100.0000 mg | ORAL_TABLET | Freq: Every day | ORAL | Status: DC
  Administered 2020-10-18 – 2020-10-25 (×8): 100 mg
  Filled 2020-10-18 (×8): qty 1

## 2020-10-18 MED ORDER — CHLORHEXIDINE GLUCONATE 0.12 % MT SOLN
15.0000 mL | Freq: Two times a day (BID) | OROMUCOSAL | Status: DC
  Administered 2020-10-18 – 2020-10-26 (×16): 15 mL via OROMUCOSAL
  Filled 2020-10-18 (×15): qty 15

## 2020-10-18 NOTE — Progress Notes (Signed)
  Speech Language Pathology Treatment: Dysphagia  Patient Details Name: Joseph Crawford MRN: [id number] DOB: Oct 08, 1950 Today's Date: 10/18/2020 Time: 4782-9562 SLP Time Calculation (min) (ACUTE ONLY): 24 min  Assessment / Plan / Recommendation Clinical Impression  Co-treatment with OT to provide increased alertness level/positioning for PO intake; pt able to follow simple commands and answer familiar personal questions with dysarthric speech noted secondary to decreased breath support and decreased intelligibility.  Voluntary swallow initiation observed and strong, congested cough only noted reflexively during intake.  Oral care provided prior to POs being given.  POs presented included ice chips and thin via tsp with anterior spillage and delayed throat clearing/immediate cough noted during presentation.  Pt was lethargic, but cooperative throughout session requiring mod-max verbal/tactile cues to attend/follow directives. Continue NPO status as pt has TF now for nutrition/hydration support.  ST will continue to f/u for PO readiness in acute setting.   HPI HPI: 70 y.o. 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  The patient has not been vaccinated against COVID-19.  He tested positive for COVID-19.  Chest x-ray showed patchy airspace disease.  Patient was hospitalized for further management.      SLP Plan  Continue with current plan of care       Recommendations  Diet recommendations: NPO Medication Administration: Via alternative means                Oral Care Recommendations: Oral care QID Follow up Recommendations: Skilled Nursing facility SLP Visit Diagnosis: Dysphagia, unspecified (R13.10) Plan:  Continue with current plan of care                       Tressie Stalker, M.S., CCC-SLP 10/18/2020, 2:41 PM

## 2020-10-18 NOTE — Progress Notes (Signed)
[facility] Progress Note  Patient: Joseph Crawford    WVP:[id number]  DOA: 10/01/2020     Date of Service: the patient was seen and examined on 10/18/2020  Brief hospital course: 70 y.o.Caucasian malewith a known history of type 2 diabetes mellitus, coronary artery disease status post four-vessel CABG, dyslipidemia, hypertension and ongoing tobacco abuse, who presented to the emergency room with acute onset of worsening dyspnea with associated dry cough as well as fever and chills for the last week. The patient has not been vaccinated against COVID-19.  He tested positive for COVID-19.  Chest x-ray showed patchy airspace disease.  Patient was hospitalized for further management.  Currently plan is monitor for improvement in mentation and oral intake.  Assessment and Plan: 1. Acute Hypoxic respiratory failure, POA, 86 % on room air on admission.  Acute COVID-19 Viral Pneumonia CXR: hazy bilateral peripheral opacities Oxygen requirement: On 3 LPM CRP: 18.2 Remdesivir: Completed remdesivir on 1/24 Steroids: Currently on IV Solu-Medrol Baricitinib/Actemra: Not indicated given concern with bacterial infection. Antibiotics: Started on 10/15/2020 for concern for aspiration pneumonia.  Currently coverage broadened to [place]. MRSA PCR negative.  Vancomycin canceled.  DVT Prophylaxis: Place and maintain sequential compression device Start: 10/15/20 1209  Prone positioning and incentive spirometer use recommended.  Overall plan: Prognosis guarded.  From Covid point of view patient appears stable but appears to have aspiration pneumonia.  Mentation is not improving.  Isolation duration: First positive test date 01/20 The treatment plan and use of medications and known side effects were discussed with patient/family. It was clearly explained that complete risks and long-term side effects are unknown. Patient/family agree with the treatment plan.    2.  Acute metabolic  encephalopathy Severe B12 deficiency Hospital induced delirium. B12 undetectable. Mentation progressively worsened during the hospital stay. Suspected secondary to infection, COVID-19 as well as electrolyte abnormality. Patient was initiated on IV antibiotics. ABG did not show hypercapnia. CT of the head unremarkable for any acute stress as well. B1 level currently pending. Ammonia, folic acid, TSH and free T4 level unremarkable.  3.  Dysphagia Unable to swallow safely secondary to encephalopathy. Currently NPO. Discussed with family currently proceeding with cortrack. Discontinue IV fluid. Tolerating tube feeding. If the patient's mentation does not improve independence 48 to 72 hours inform family that patient consider comfort versus further aggressive care for the patient.  4.  Type 2 diabetes mellitus with hyperglycemia without any complication Hemoglobin A1c 6.4. Holding home regimen. Currently on sliding scale insulin.  5.  Essential hypertension Blood pressure stable. As needed IV Lopressor  6.  Severe thrombocytopenia Likely in the setting of acute illness. H&H relatively stable but No schistocytes on the smear. Fibrinogen level 424, INR normal thus less likely hemolytic process.  7.  Sinus tachycardia. Currently stable. Monitor.  8.  GERD. Monitor  9.  Goals of care conversation. Extensive discussion with both daughters on the phone on 1/26. Patient's prognosis is very poor given his ongoing encephalopathy as well as difficulty swallowing. Appears to be suffering from COVID-19 pneumonia as well as bacterial pneumonia. Severe thrombocytopenia likely in response to marrow suppression from severe infection. Outcome of the situation the patient is in in the setting of a cardiac arrest is very poor.  Neurological recovery is less likely.  Based on this, after my recommendation family decided that patient should not be resuscitated or intubated in the setting of  acute cardiac arrest/respiratory arrest. . Diet: N.p.o., DVT Prophylaxis:   Place and maintain sequential compression  device Start: 10/15/20 1209    Advance goals of care discussion: Limited code  Family Communication: no family was present at bedside, at the time of interview.  Discussed with daughter  Disposition:  Status is: Inpatient  Remains inpatient appropriate because:IV treatments appropriate due to intensity of illness or inability to take PO and Inpatient level of care appropriate due to severity of illness   Dispo:  Patient From: Home  Planned Disposition: Skilled Nursing Facility  Expected discharge date: 10/22/2020  Medically stable for discharge: No     Subjective: Patient still not back to baseline.  Patient unable to answer questions follow commands.  No nausea no vomiting.  Appears less dehydrated.  Physical Exam:  General: Appear in mild distress, no Rash; Oral Mucosa Clear, moist. no Abnormal Neck Mass Or lumps, Conjunctiva normal  Cardiovascular: S1 and S2 Present, no Murmur, Respiratory: increased respiratory effort, Bilateral Air entry present and bilateral  Crackles, no wheezes Abdomen: Bowel Sound present, Soft and no tenderness Extremities: no Pedal edema Neurology: lethargic and not oriented to time, place, and person affect flat in affect. no new focal deficit Gait not checked due to patient safety concerns  Vitals:   10/18/20 0339 10/18/20 0807 10/18/20 1209 10/18/20 1609  BP: 124/70 116/69 137/64 126/75  Pulse: 78 86 77 76  Resp: 18 19 20 20   Temp: 97.8 F (36.6 C) 97.9 F (36.6 C) 97.6 F (36.4 C) 97.9 F (36.6 C)  TempSrc: Oral Oral Oral Axillary  SpO2: 95% 95% 95% 97%  Weight:      Height:        Intake/Output Summary (Last 24 hours) at 10/18/2020 1944 Last data filed at 10/18/2020 1800 Gross per 24 hour  Intake 1394.19 ml  Output -  Net 1394.19 ml   Filed Weights   2020/11/02 1625 10/13/20 0457 10/17/20 0411  Weight: 79.4 kg  75.1 kg 74 kg    Data Reviewed: I have personally reviewed and interpreted daily labs, tele strips, imaging. I reviewed all nursing notes, pharmacy notes, vitals, pertinent old records I have discussed plan of care as described above with RN and patient/family.  CBC: Recent Labs  Lab 10/12/20 0232 10/13/20 0251 10/14/20 0301 10/15/20 0814 10/16/20 0307 10/17/20 0158 10/17/20 1226 10/18/20 0542  WBC 6.8 8.2 8.7 9.6 8.6 8.7  --  11.1*  NEUTROABS 5.2 6.1 6.4 8.0* 6.7  --   --   --   HGB 12.0* 13.1 13.6 13.1 13.1 12.3*  --  12.0*  HCT 34.0* 38.7* 39.5 36.8* 37.8* 33.5*  --  33.2*  MCV 105.9* 106.0* 105.1* 104.5* 104.7* 104.7*  --  107.1*  PLT 117* 141* 128* 95* 87* 90* 83* 88*   Basic Metabolic Panel: Recent Labs  Lab 10/14/20 0301 10/15/20 0814 10/16/20 0307 10/17/20 0158 10/17/20 1852 10/18/20 0542  NA 151* 150* 148* 150* 149* 147*  K 3.1* 3.4* 3.6 3.7 4.0 3.7  CL 112* 114* 112* 114* 116* 112*  CO2 24 21* 24 25 24 26   GLUCOSE 144* 277* 262* 202* 288* 274*  BUN 40* 30* 26* 28* 27* 28*  CREATININE 1.28* 1.34* 1.28* 1.18 1.12 1.01  CALCIUM 8.8* 8.5* 8.2* 8.1* 8.0* 8.2*  MG 2.3  --  2.2  --   --   --     Studies: No results found.  Scheduled Meds: . chlorhexidine  15 mL Mouth Rinse BID  . cyanocobalamin  1,000 mcg Subcutaneous Q1200  . folic acid  1 mg Per Tube Daily  .  free water  200 mL Per Tube Q4H  . insulin aspart  0-15 Units Subcutaneous Q4H  . insulin detemir  5 Units Subcutaneous QHS  . mouth rinse  15 mL Mouth Rinse q12n4p  . methylPREDNISolone (SOLU-MEDROL) injection  20 mg Intravenous Daily  . multivitamin  15 mL Per Tube Daily  . thiamine  100 mg Per Tube Daily   Continuous Infusions: . cefTAZidime (FORTAZ)  IV 200 mL/hr at 10/18/20 1701  . feeding supplement (JEVITY 1.2 CAL) 55 mL/hr at 10/18/20 1608   PRN Meds: acetaminophen, labetalol, [DISCONTINUED] ondansetron **OR** ondansetron (ZOFRAN) IV  Time spent: 35 minutes  Author: Lynden Oxford,  MD [facility] 10/18/2020 7:44 PM  To reach On-call, see care teams to locate the attending and reach out via [email or web address]. Between 7PM-7AM, please contact night-coverage If you still have difficulty reaching the attending provider, please page the [facility] (Director on Call) for [facility] on amion for assistance.

## 2020-10-18 NOTE — Progress Notes (Signed)
Inpatient Diabetes Program Recommendations  AACE/ADA: New Consensus Statement on Inpatient Glycemic Control (2015)  Target Ranges:  Prepandial:   less than 140 mg/dL      Peak postprandial:   less than 180 mg/dL (1-2 hours)      Critically ill patients:  140 - 180 mg/dL   Lab Results  Component Value Date   GLUCAP 207 (H) 10/18/2020   HGBA1C 6.4 (H) 10/12/2020    Review of Glycemic Control Results for [name], [name] (MRN [id number]) as of 10/18/2020 15:10  Ref. Range 10/17/2020 12:34 10/17/2020 15:23 10/17/2020 23:00 10/18/2020 03:13 10/18/2020 08:04 10/18/2020 11:48  Glucose-Capillary Latest Ref Range: 70 - 99 mg/dL 975 (H) 300 (H) 511 (H) 240 (H) 248 (H) 207 (H)   Diabetes history: DM 2 Outpatient Diabetes medications: Metformin 850 mg bid Current orders for Inpatient glycemic control:  Novolog moderate q 4 hours, Solumedrol 20 mg IV daily Jevity 75 cc/hr Inpatient Diabetes Program Recommendations:   Recommend adding Levemir 5 units bid.   Thanks  Joseph Meager, RN, BC-ADM Inpatient Diabetes Coordinator Pager [phone] (8a-5p)

## 2020-10-18 NOTE — Progress Notes (Signed)
CSW spoke with patients daughter Misty Stanley and she wants CSW to call back tomorrow afternoon to discuss plan for patient for when he is medically ready. She wants to talk with family.CSW to continue to follow and assist with discharge planning needs.

## 2020-10-18 NOTE — Progress Notes (Signed)
Occupational Therapy Treatment Patient Details Name: Joseph Crawford MRN: [id number] DOB: [birth year] Today's Date: 10/18/2020    History of present illness 70 y.o. 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 Pt found COVID + Pulse ox 86% on RA. Chest x-ray showed vague patchy airspace opacities in the left base. Admitted 11/04/2020 for treatment of COVID. Pt began with confusion 1/22 and head CT negative. Concern for aspiration PNA.   OT comments  Pt assisted to sit EOB x 20 minutes while participating in swallowing trials with SLP. Pt with eyes closed throughout session. Pt able to state his name, birthday, follow one step commands inconsistently and chose between ice and water from spoon. He demonstrated poor to fair sitting balance. Pt with stable VS, Sp02 on 94% on 3L 02.   Follow Up Recommendations  SNF;Supervision/Assistance - 24 hour    Equipment Recommendations  Wheelchair (measurements OT);Wheelchair cushion (measurements OT);Hospital bed;3 in 1 bedside commode    Recommendations for Other Services      Precautions / Restrictions Precautions Precautions: Fall Precaution Comments: cortrak       Mobility Bed Mobility Overal bed mobility: Needs Assistance Bed Mobility: Supine to Sit;Sit to Supine     Supine to sit: Max assist;HOB elevated Sit to supine: Total assist   General bed mobility comments: assist for all aspects, pt assisting somewhat with raising his trunk, +2 total assist to pull up in bed  Transfers                 General transfer comment: deferred    Balance Overall balance assessment: Needs assistance Sitting-balance support: Bilateral upper extremity supported;Feet  supported Sitting balance-Leahy Scale: Poor Sitting balance - Comments: able to sit momentarily with min guard assist, primarily requring mod assist with pt listing R with flexed posture                                   ADL either performed or assessed with clinical judgement   ADL   Eating/Feeding: Total assistance;Sitting Eating/Feeding Details (indicate cue type and reason): fed by SLP                                         Vision       Perception     Praxis      Cognition Arousal/Alertness: Lethargic (eyes closed throughout session) Behavior During Therapy: Flat affect Overall Cognitive Status: Impaired/Different from baseline Area of Impairment: Orientation;Memory;Following commands;Safety/judgement;Awareness;Problem solving                 Orientation Level: Disoriented to;Place;Time;Situation (knew his name and birthday)   Memory: Decreased short-term memory Following Commands: Follows one step commands inconsistently;Follows one step commands with increased time Safety/Judgement: Decreased awareness of safety;Decreased awareness of deficits Awareness: Intellectual Problem Solving: Slow processing;Decreased initiation;Difficulty sequencing;Requires verbal cues;Requires tactile cues General Comments: followed commands to open his mouth        Exercises     Shoulder Instructions       General Comments      Pertinent Vitals/ Pain  Pain Assessment: Faces Faces Pain Scale: No hurt  Home Living                                          Prior Functioning/Environment              Frequency  Min 2X/week        Progress Toward Goals  OT Goals(current goals can now be found in the care plan section)  Progress towards OT goals: Not progressing toward goals - comment  Acute Rehab OT Goals Patient Stated Goal: none stated OT Goal Formulation: Patient unable to participate in goal  setting Time For Goal Achievement: 10/31/20 Potential to Achieve Goals: Fair  Plan Discharge plan remains appropriate    Co-evaluation    PT/OT/SLP Co-Evaluation/Treatment: Yes Reason for Co-Treatment: Necessary to address cognition/behavior during functional activity   OT goals addressed during session: Strengthening/ROM SLP goals addressed during session: Swallowing    AM-PAC OT "6 Clicks" Daily Activity     Outcome Measure   Help from another person eating meals?: Total Help from another person taking care of personal grooming?: Total Help from another person toileting, which includes using toliet, bedpan, or urinal?: Total Help from another person bathing (including washing, rinsing, drying)?: Total Help from another person to put on and taking off regular upper body clothing?: Total Help from another person to put on and taking off regular lower body clothing?: Total 6 Click Score: 6    End of Session Equipment Utilized During Treatment: Oxygen (3L)  OT Visit Diagnosis: Unsteadiness on feet (R26.81);Other abnormalities of gait and mobility (R26.89);Muscle weakness (generalized) (M62.81);Other symptoms and signs involving cognitive function   Activity Tolerance Patient limited by lethargy   Patient Left in bed;with call bell/phone within reach;with bed alarm set   Nurse Communication Mobility status        Time: 1400-1426 OT Time Calculation (min): 26 min  Charges: OT General Charges $OT Visit: 1 Visit OT Treatments $Therapeutic Activity: 23-37 mins  Martie Round, OTR/L Acute Rehabilitation Services Pager: [phone] Office: [phone]   Evern Bio 10/18/2020, 2:55 PM

## 2020-10-19 DIAGNOSIS — J9601 Acute respiratory failure with hypoxia: Secondary | ICD-10-CM | POA: Diagnosis not present

## 2020-10-19 DIAGNOSIS — U071 COVID-19: Secondary | ICD-10-CM | POA: Diagnosis not present

## 2020-10-19 LAB — GLUCOSE, CAPILLARY
Glucose-Capillary: 191 mg/dL — ABNORMAL HIGH (ref 70–99)
Glucose-Capillary: 195 mg/dL — ABNORMAL HIGH (ref 70–99)
Glucose-Capillary: 215 mg/dL — ABNORMAL HIGH (ref 70–99)
Glucose-Capillary: 249 mg/dL — ABNORMAL HIGH (ref 70–99)
Glucose-Capillary: 265 mg/dL — ABNORMAL HIGH (ref 70–99)
Glucose-Capillary: 314 mg/dL — ABNORMAL HIGH (ref 70–99)

## 2020-10-19 LAB — CBC
HCT: 31.4 % — ABNORMAL LOW (ref 39.0–52.0)
Hemoglobin: 10.8 g/dL — ABNORMAL LOW (ref 13.0–17.0)
MCH: 37.4 pg — ABNORMAL HIGH (ref 26.0–34.0)
MCHC: 34.4 g/dL (ref 30.0–36.0)
MCV: 108.7 fL — ABNORMAL HIGH (ref 80.0–100.0)
Platelets: 101 10*3/uL — ABNORMAL LOW (ref 150–400)
RBC: 2.89 MIL/uL — ABNORMAL LOW (ref 4.22–5.81)
RDW: 18.5 % — ABNORMAL HIGH (ref 11.5–15.5)
WBC: 12.3 10*3/uL — ABNORMAL HIGH (ref 4.0–10.5)
nRBC: 0.3 % — ABNORMAL HIGH (ref 0.0–0.2)

## 2020-10-19 LAB — BASIC METABOLIC PANEL
Anion gap: 8 (ref 5–15)
BUN: 22 mg/dL (ref 8–23)
CO2: 29 mmol/L (ref 22–32)
Calcium: 9.1 mg/dL (ref 8.9–10.3)
Chloride: 112 mmol/L — ABNORMAL HIGH (ref 98–111)
Creatinine, Ser: 0.88 mg/dL (ref 0.61–1.24)
GFR, Estimated: >60 mL/min (ref >60)
Glucose, Bld: 270 mg/dL — ABNORMAL HIGH (ref 70–99)
Potassium: 3.8 mmol/L (ref 3.5–5.1)
Sodium: 149 mmol/L — ABNORMAL HIGH (ref 135–145)

## 2020-10-19 LAB — C-REACTIVE PROTEIN: CRP: 5.5 mg/dL — ABNORMAL HIGH (ref <1.0)

## 2020-10-19 MED ORDER — PREDNISONE 10 MG PO TABS
10.0000 mg | ORAL_TABLET | Freq: Every day | ORAL | Status: AC
  Administered 2020-10-22 – 2020-10-24 (×3): 10 mg
  Filled 2020-10-19 (×4): qty 1

## 2020-10-19 MED ORDER — LACTULOSE 10 GM/15ML PO SOLN
20.0000 g | Freq: Once | ORAL | Status: AC
  Administered 2020-10-19: 20 g
  Filled 2020-10-19: qty 30

## 2020-10-19 MED ORDER — ENOXAPARIN SODIUM 40 MG/0.4ML ~~LOC~~ SOLN
40.0000 mg | Freq: Every day | SUBCUTANEOUS | Status: DC
  Administered 2020-10-19 – 2020-10-27 (×9): 40 mg via SUBCUTANEOUS
  Filled 2020-10-19 (×8): qty 0.4

## 2020-10-19 MED ORDER — SODIUM CHLORIDE 0.9 % IV SOLN
2.0000 g | Freq: Three times a day (TID) | INTRAVENOUS | Status: AC
  Administered 2020-10-19 – 2020-10-22 (×9): 2 g via INTRAVENOUS
  Filled 2020-10-19 (×8): qty 2

## 2020-10-19 MED ORDER — QUETIAPINE FUMARATE 25 MG PO TABS
12.5000 mg | ORAL_TABLET | Freq: Every day | ORAL | Status: DC
  Administered 2020-10-19: 12.5 mg
  Filled 2020-10-19: qty 1

## 2020-10-19 MED ORDER — INSULIN DETEMIR 100 UNIT/ML ~~LOC~~ SOLN
5.0000 [IU] | Freq: Two times a day (BID) | SUBCUTANEOUS | Status: DC
  Administered 2020-10-19 – 2020-10-27 (×16): 5 [IU] via SUBCUTANEOUS
  Filled 2020-10-19 (×18): qty 0.05

## 2020-10-19 MED ORDER — PREDNISONE 20 MG PO TABS
20.0000 mg | ORAL_TABLET | Freq: Every day | ORAL | Status: AC
  Administered 2020-10-19 – 2020-10-21 (×3): 20 mg
  Filled 2020-10-19 (×4): qty 1

## 2020-10-19 MED ORDER — DOCUSATE SODIUM 50 MG/5ML PO LIQD
100.0000 mg | Freq: Two times a day (BID) | ORAL | Status: DC
  Administered 2020-10-19 (×2): 100 mg
  Filled 2020-10-19 (×3): qty 10

## 2020-10-19 NOTE — Progress Notes (Signed)
Pharmacy Antibiotic Note  Joseph Crawford is a 70 y.o. male admitted on 10/18/2020 with worsening shortness of breath.  Pharmacy has been consulted for unasyn dosing for possible aspiration pneumonia broadened antibiotics to vancomycin and ceftazidime  Tmax 99, wbc up to 12 this morning.   No growth on cultures, vancomycin stopped yesterday. Renal function now normal.   Plan: Fortaz 2g q8 hours - no plans for dose adjustment Will sign off and follow peripherally for dose adjustments  Height: 6' (182.9 cm) Weight: 74 kg (163 lb 2.3 oz) IBW/kg (Calculated) : 77.6  Temp (24hrs), Avg:98.8 F (37.1 C), Min:97.9 F (36.6 C), Max:99.7 F (37.6 C)  Recent Labs  Lab 10/15/20 0814 10/16/20 0307 10/17/20 0158 10/17/20 1852 10/18/20 0542 10/19/20 0551  WBC 9.6 8.6 8.7  --  11.1* 12.3*  CREATININE 1.34* 1.28* 1.18 1.12 1.01 0.88    Estimated Creatinine Clearance: 81.8 mL/min (by C-G formula based on SCr of 0.88 mg/dL).    Allergies  Allergen Reactions  . Statins Other (See Comments)    Per pt: muscle loss and weakness      Thank you for allowing pharmacy to be a part of this patient's care.  Sheppard Coil PharmD., BCPS Clinical Pharmacist 10/19/2020 2:17 PM

## 2020-10-19 NOTE — Progress Notes (Signed)
Physical Therapy Treatment Patient Details Name: Joseph Crawford MRN: [id number] DOB: March 31, 1951 Today's Date: 10/19/2020    History of Present Illness 70 y.o. 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 Pt found COVID + Pulse ox 86% on RA. Chest x-ray showed vague patchy airspace opacities in the left base. Admitted 10/08/2020 for treatment of COVID. Pt began with confusion 1/22 and head CT negative. Concern for aspiration PNA.    PT Comments    Pt continues to require significant assist with all mobility. Continue to recommend SNF.    Follow Up Recommendations  SNF     Equipment Recommendations  Rolling walker with 5" wheels;Wheelchair (measurements PT);Wheelchair cushion (measurements PT);Hospital bed (hoyer lift)    Recommendations for Other Services       Precautions / Restrictions Precautions Precautions: Fall Precaution Comments: cortrak    Mobility  Bed Mobility Overal bed mobility: Needs Assistance Bed Mobility: Supine to Sit;Sit to Supine     Supine to sit: Mod assist;HOB elevated Sit to supine: Max assist   General bed mobility comments: Assist to bring legs off of bed and to initiate elevating trunk into sitting. Assist to lower trunk and bring legs back into bed  Transfers                 General transfer comment: Unable to stand with 1 person assist  Ambulation/Gait                 Stairs             Wheelchair Mobility    Modified Rankin (Stroke Patients Only)       Balance Overall balance assessment: Needs assistance Sitting-balance support: Bilateral upper extremity supported;Feet supported Sitting balance-Leahy Scale: Poor Sitting balance - Comments:  UE support and min guard                                    Cognition Arousal/Alertness: Lethargic (eyes closed throughout most of session) Behavior During Therapy: Flat affect Overall Cognitive Status: Impaired/Different from baseline Area of Impairment: Orientation;Memory;Following commands;Safety/judgement;Awareness;Problem solving                 Orientation Level: Disoriented to;Place;Time;Situation   Memory: Decreased short-term memory Following Commands: Follows one step commands inconsistently;Follows one step commands with increased time Safety/Judgement: Decreased awareness of safety;Decreased awareness of deficits Awareness: Intellectual Problem Solving: Slow processing;Decreased initiation;Difficulty sequencing;Requires verbal cues;Requires tactile cues        Exercises      General Comments        Pertinent Vitals/Pain Pain Assessment: Faces Faces Pain Scale: No hurt    Home Living                      Prior Function            PT Goals (current goals can now be found in the care plan section) Acute Rehab PT Goals Patient Stated Goal: none stated Progress towards PT goals: Not progressing toward goals - comment    Frequency    Min 2X/week      PT Plan Current plan remains appropriate;Frequency needs to be updated  Co-evaluation              AM-PAC PT "6 Clicks" Mobility   Outcome Measure  Help needed turning from your back to your side while in a flat bed without using bedrails?: Total Help needed moving from lying on your back to sitting on the side of a flat bed without using bedrails?: A Lot Help needed moving to and from a bed to a chair (including a wheelchair)?: Total Help needed standing up from a chair using your arms (e.g., wheelchair or bedside chair)?: Total Help needed to walk in hospital room?: Total Help needed climbing 3-5 steps with a railing? : Total 6 Click Score: 7    End of Session    Activity Tolerance: Patient limited by lethargy Patient left: in bed;with call bell/phone within reach;with bed alarm set Nurse Communication: Mobility status PT Visit Diagnosis: Unsteadiness on feet (R26.81);Other abnormalities of gait and mobility (R26.89);Muscle weakness (generalized) (M62.81)     Time: 1540-0867 PT Time Calculation (min) (ACUTE ONLY): 19 min  Charges:  $Therapeutic Activity: 8-22 mins                     Sarah Bush [facility] PT Acute Rehabilitation Services Pager [phone] Office [phone]    Angelina Ok [facility] 10/19/2020, 5:05 PM

## 2020-10-19 NOTE — Progress Notes (Addendum)
Inpatient Diabetes Program Recommendations  AACE/ADA: New Consensus Statement on Inpatient Glycemic Control (2015)  Target Ranges:  Prepandial:   less than 140 mg/dL      Peak postprandial:   less than 180 mg/dL (1-2 hours)      Critically ill patients:  140 - 180 mg/dL   Lab Results  Component Value Date   GLUCAP 314 (H) 10/19/2020   HGBA1C 6.4 (H) 10/12/2020    Review of Glycemic Control Results for HOY, FALLERT (MRN [id number]) as of 10/19/2020 13:42  Ref. Range 10/18/2020 20:01 10/19/2020 00:07 10/19/2020 03:34 10/19/2020 07:56 10/19/2020 11:07  Glucose-Capillary Latest Ref Range: 70 - 99 mg/dL 681 (H) 275 (H) 170 (H) 265 (H) 314 (H)  Diabetes history: DM 2 Outpatient Diabetes medications: Metformin 850 mg bid Current orders for Inpatient glycemic control:  Novolog moderate q 4 hours, Solumedrol 20 mg IV daily Jevity 75 cc/hr, Levemir 5 units q HS  Inpatient Diabetes Program Recommendations:    Consider increasing Levemir to 5 units bid.   Thanks,  Beryl Meager, RN, BC-ADM Inpatient Diabetes Coordinator Pager [phone] (8a-5p)

## 2020-10-19 NOTE — Progress Notes (Addendum)
[facility] Progress Note  Patient: Joseph Crawford    CBJ:[id number]  DOA: 09/24/2020     Date of Service: the patient was seen and examined on 10/19/2020  Brief hospital course: 70 y.o.Caucasian malewith a known history of type 2 diabetes mellitus, coronary artery disease status post four-vessel CABG, dyslipidemia, hypertension and ongoing tobacco abuse, who presented to the emergency room with acute onset of worsening dyspnea with associated dry cough as well as fever and chills for the last week. The patient has not been vaccinated against COVID-19.  He tested positive for COVID-19.  Chest x-ray showed patchy airspace disease.  Patient was hospitalized for further management.  Currently plan is treat agitation, monitor for improvement in mentation and oral intake  Assessment and Plan: 1. Acute Hypoxic respiratory failure, POA, 86 % on room air on admission.  Acute COVID-19 Viral Pneumonia CXR: hazy bilateral peripheral opacities Oxygen requirement: On 2 LPM 91% on room air. CRP: Trending down, 5.5. Remdesivir: Completed remdesivir on 1/24 Steroids: Currently on IV Solu-Medrol, switch to prednisone taper. Baricitinib/Actemra: Not indicated given concern with bacterial infection. Antibiotics: Started on 10/15/2020 for concern for aspiration pneumonia.  Currently coverage broadened to [place].  Last day of antibiotics 10/23/20 MRSA PCR negative.  Vancomycin canceled.  DVT Prophylaxis: enoxaparin (LOVENOX) injection 40 mg Start: 10/19/20 1000 Place and maintain sequential compression device Start: 10/15/20 1209  Prone positioning and incentive spirometer use recommended.  Overall plan: Prognosis guarded.  From Covid point of view patient appears stable but appears to have aspiration pneumonia.  Mentation stable.  Isolation duration: First positive test date 01/20, out of isolation 11/02/2020  The treatment plan and use of medications and known side effects were discussed with  patient/family. It was clearly explained that complete risks and long-term side effects are unknown. Patient/family agree with the treatment plan.    2.  Acute metabolic encephalopathy Severe B12 deficiency Hospital induced delirium. B12 undetectable. Mentation progressively worsened during the hospital stay. Suspected secondary to infection, COVID-19 as well as electrolyte abnormality. Patient was initiated on IV antibiotics. ABG did not show hypercapnia. CT of the head unremarkable for any acute stress as well. B1 level currently pending. Continue supplementation Ammonia, folic acid, TSH and free T4 level unremarkable. Seroquel added for agitation.  3.  Dysphagia Unable to swallow safely secondary to encephalopathy. Currently NPO. Discussed with family currently proceeding with cortrack. Discontinue IV fluid. Tolerating tube feeding. If the patient's mentation does not improve independence 48 to 72 hours inform family that patient consider comfort versus further aggressive care for the patient.  4.  Type 2 diabetes mellitus with hyperglycemia without any complication Hemoglobin A1c 6.4. Holding home regimen. Currently on sliding scale insulin.  Added Levemir  5.  Essential hypertension Blood pressure stable. As needed IV Lopressor  6.  Severe thrombocytopenia Likely in the setting of acute illness. H&H relatively stable but No schistocytes on the smear. Fibrinogen level 424, INR normal thus less likely hemolytic process. Now improving.  Will initiate DVT prophylaxis  7.  Sinus tachycardia. Currently stable. Monitor.  8.  GERD. Monitor  9.  Hypernatremia. Currently receiving free water Initially was on D5. Increased on 1/28 is likely secondary to discontinuation of the D5. Monitor.  10. Goals of care conversation. Extensive discussion with both daughters on the phone on 1/26. Patient's prognosis is very poor given his ongoing encephalopathy as well as  difficulty swallowing. Appears to be suffering from COVID-19 pneumonia as well as bacterial pneumonia. Severe thrombocytopenia likely in  response to marrow suppression from severe infection. Outcome of the situation the patient is in in the setting of a cardiac arrest is very poor.  Neurological recovery is less likely.  Based on this, after my recommendation family decided that patient should not be resuscitated or intubated in the setting of acute cardiac arrest/respiratory arrest.  Diet: N.p.o. DVT Prophylaxis:   enoxaparin (LOVENOX) injection 40 mg Start: 10/19/20 1000 Place and maintain sequential compression device Start: 10/15/20 1209   Advance goals of care discussion: Limited code  Family Communication: no family was present at bedside, at the time of interview.  Discussed with daughter  Disposition:  Status is: Inpatient  Remains inpatient appropriate because:IV treatments appropriate due to intensity of illness or inability to take PO and Inpatient level of care appropriate due to severity of illness  Dispo:  Patient From: Home  Planned Disposition: Skilled Nursing Facility  Expected discharge date: 10/22/2020  Medically stable for discharge: No     Subjective: No nausea no vomiting.  No fever no chills.  No chest pain.  Continues to have some cough.  Mild shortness of breath.  Agitated as the day progressed.  Physical Exam:  General: Appear in mild distress, no Rash; Oral Mucosa Clear, moist. no Abnormal Neck Mass Or lumps, Conjunctiva normal  Cardiovascular: S1 and S2 Present, no Murmur, Respiratory: normal respiratory effort, Bilateral Air entry present and bilateral  Crackles, no wheezes Abdomen: Bowel Sound present, Soft and no tenderness Extremities: trace Pedal edema Neurology: alert and oriented to time, place, and person affect appropriate. no new focal deficit Gait not checked due to patient safety concerns    Vitals:   10/19/20 0335 10/19/20 0753 10/19/20  1108 10/19/20 1609  BP: (!) 160/73 123/66 119/60 111/64  Pulse: 90 99 85 84  Resp: 19 20 (!) 21 (!) 21  Temp: 99.7 F (37.6 C) 98.6 F (37 C) 99.5 F (37.5 C) 98.1 F (36.7 C)  TempSrc: Axillary Axillary Axillary Oral  SpO2: 94% 95% 95% 94%  Weight:      Height:        Intake/Output Summary (Last 24 hours) at 10/19/2020 1938 Last data filed at 10/19/2020 1731 Gross per 24 hour  Intake 100 ml  Output 2000 ml  Net -1900 ml   Filed Weights   10/20/20 1625 10/13/20 0457 10/17/20 0411  Weight: 79.4 kg 75.1 kg 74 kg    Data Reviewed: I have personally reviewed and interpreted daily labs, tele strips, imaging. I reviewed all nursing notes, pharmacy notes, vitals, pertinent old records I have discussed plan of care as described above with RN and patient/family.  CBC: Recent Labs  Lab 10/13/20 0251 10/14/20 0301 10/15/20 0814 10/16/20 0307 10/17/20 0158 10/17/20 1226 10/18/20 0542 10/19/20 0551  WBC 8.2 8.7 9.6 8.6 8.7  --  11.1* 12.3*  NEUTROABS 6.1 6.4 8.0* 6.7  --   --   --   --   HGB 13.1 13.6 13.1 13.1 12.3*  --  12.0* 10.8*  HCT 38.7* 39.5 36.8* 37.8* 33.5*  --  33.2* 31.4*  MCV 106.0* 105.1* 104.5* 104.7* 104.7*  --  107.1* 108.7*  PLT 141* 128* 95* 87* 90* 83* 88* 101*   Basic Metabolic Panel: Recent Labs  Lab 10/14/20 0301 10/15/20 0814 10/16/20 0307 10/17/20 0158 10/17/20 1852 10/18/20 0542 10/19/20 0551  NA 151*   < > 148* 150* 149* 147* 149*  K 3.1*   < > 3.6 3.7 4.0 3.7 3.8  CL 112*   < >  112* 114* 116* 112* 112*  CO2 24   < > 24 25 24 26 29   GLUCOSE 144*   < > 262* 202* 288* 274* 270*  BUN 40*   < > 26* 28* 27* 28* 22  CREATININE 1.28*   < > 1.28* 1.18 1.12 1.01 0.88  CALCIUM 8.8*   < > 8.2* 8.1* 8.0* 8.2* 9.1  MG 2.3  --  2.2  --   --   --   --    < > = values in this interval not displayed.    Studies: No results found.  Scheduled Meds: . chlorhexidine  15 mL Mouth Rinse BID  . cyanocobalamin  1,000 mcg Subcutaneous Q1200  . docusate   100 mg Per Tube BID  . enoxaparin (LOVENOX) injection  40 mg Subcutaneous Daily  . folic acid  1 mg Per Tube Daily  . free water  200 mL Per Tube Q4H  . insulin aspart  0-15 Units Subcutaneous Q4H  . insulin detemir  5 Units Subcutaneous BID  . mouth rinse  15 mL Mouth Rinse q12n4p  . multivitamin  15 mL Per Tube Daily  . predniSONE  20 mg Per Tube Q breakfast   Followed by  . [START ON 10/22/2020] predniSONE  10 mg Per Tube Q breakfast  . QUEtiapine  12.5 mg Per Tube QHS  . thiamine  100 mg Per Tube Daily   Continuous Infusions: . cefTAZidime (FORTAZ)  IV 2 g (10/19/20 1727)  . feeding supplement (JEVITY 1.2 CAL) 1,000 mL (10/19/20 1731)   PRN Meds: acetaminophen, labetalol, [DISCONTINUED] ondansetron **OR** ondansetron (ZOFRAN) IV  Time spent: 35 minutes  Author: 10/21/20, MD [facility] 10/19/2020 7:38 PM  To reach On-call, see care teams to locate the attending and reach out via [email or web address]. Between 7PM-7AM, please contact night-coverage If you still have difficulty reaching the attending provider, please page the [facility] (Director on Call) for [facility] on amion for assistance.

## 2020-10-20 ENCOUNTER — Inpatient Hospital Stay (HOSPITAL_COMMUNITY): Payer: Medicare Other

## 2020-10-20 DIAGNOSIS — I472 Ventricular tachycardia: Secondary | ICD-10-CM | POA: Diagnosis not present

## 2020-10-20 DIAGNOSIS — U071 COVID-19: Secondary | ICD-10-CM | POA: Diagnosis not present

## 2020-10-20 LAB — BASIC METABOLIC PANEL
Anion gap: 9 (ref 5–15)
Anion gap: 6 (ref 5–15)
Anion gap: 8 (ref 5–15)
BUN: 19 mg/dL (ref 8–23)
BUN: 18 mg/dL (ref 8–23)
BUN: 18 mg/dL (ref 8–23)
CO2: 29 mmol/L (ref 22–32)
CO2: 30 mmol/L (ref 22–32)
CO2: 30 mmol/L (ref 22–32)
Calcium: 9.4 mg/dL (ref 8.9–10.3)
Calcium: 9.3 mg/dL (ref 8.9–10.3)
Calcium: 9.3 mg/dL (ref 8.9–10.3)
Chloride: 113 mmol/L — ABNORMAL HIGH (ref 98–111)
Chloride: 113 mmol/L — ABNORMAL HIGH (ref 98–111)
Chloride: 109 mmol/L (ref 98–111)
Creatinine, Ser: 0.85 mg/dL (ref 0.61–1.24)
Creatinine, Ser: 0.81 mg/dL (ref 0.61–1.24)
Creatinine, Ser: 0.83 mg/dL (ref 0.61–1.24)
GFR, Estimated: >60 mL/min (ref >60)
GFR, Estimated: >60 mL/min (ref >60)
GFR, Estimated: >60 mL/min (ref >60)
Glucose, Bld: 224 mg/dL — ABNORMAL HIGH (ref 70–99)
Glucose, Bld: 163 mg/dL — ABNORMAL HIGH (ref 70–99)
Glucose, Bld: 287 mg/dL — ABNORMAL HIGH (ref 70–99)
Potassium: 3.6 mmol/L (ref 3.5–5.1)
Potassium: 4.3 mmol/L (ref 3.5–5.1)
Potassium: 4.8 mmol/L (ref 3.5–5.1)
Sodium: 151 mmol/L — ABNORMAL HIGH (ref 135–145)
Sodium: 149 mmol/L — ABNORMAL HIGH (ref 135–145)
Sodium: 147 mmol/L — ABNORMAL HIGH (ref 135–145)

## 2020-10-20 LAB — CULTURE, BLOOD (ROUTINE X 2)
Culture: NO GROWTH
Culture: NO GROWTH
Special Requests: ADEQUATE
Special Requests: ADEQUATE

## 2020-10-20 LAB — CBC
HCT: 28.5 % — ABNORMAL LOW (ref 39.0–52.0)
Hemoglobin: 10.2 g/dL — ABNORMAL LOW (ref 13.0–17.0)
MCH: 38.9 pg — ABNORMAL HIGH (ref 26.0–34.0)
MCHC: 35.8 g/dL (ref 30.0–36.0)
MCV: 108.8 fL — ABNORMAL HIGH (ref 80.0–100.0)
Platelets: 100 10*3/uL — ABNORMAL LOW (ref 150–400)
RBC: 2.62 MIL/uL — ABNORMAL LOW (ref 4.22–5.81)
RDW: 18.7 % — ABNORMAL HIGH (ref 11.5–15.5)
WBC: 11.5 10*3/uL — ABNORMAL HIGH (ref 4.0–10.5)
nRBC: 0.3 % — ABNORMAL HIGH (ref 0.0–0.2)

## 2020-10-20 LAB — GLUCOSE, CAPILLARY
Glucose-Capillary: 182 mg/dL — ABNORMAL HIGH (ref 70–99)
Glucose-Capillary: 182 mg/dL — ABNORMAL HIGH (ref 70–99)
Glucose-Capillary: 204 mg/dL — ABNORMAL HIGH (ref 70–99)
Glucose-Capillary: 224 mg/dL — ABNORMAL HIGH (ref 70–99)
Glucose-Capillary: 255 mg/dL — ABNORMAL HIGH (ref 70–99)
Glucose-Capillary: 278 mg/dL — ABNORMAL HIGH (ref 70–99)

## 2020-10-20 LAB — ECHOCARDIOGRAM LIMITED
Area-P 1/2: 3.91 cm2
Height: 72 in
S' Lateral: 2.8 cm
Weight: 2610.25 oz

## 2020-10-20 LAB — MAGNESIUM: Magnesium: 2 mg/dL (ref 1.7–2.4)

## 2020-10-20 LAB — C-REACTIVE PROTEIN: CRP: 3.8 mg/dL — ABNORMAL HIGH (ref <1.0)

## 2020-10-20 MED ORDER — SACCHAROMYCES BOULARDII 250 MG PO CAPS
250.0000 mg | ORAL_CAPSULE | Freq: Two times a day (BID) | ORAL | Status: DC
  Administered 2020-10-20 – 2020-10-26 (×14): 250 mg
  Filled 2020-10-20 (×15): qty 1

## 2020-10-20 MED ORDER — POTASSIUM CHLORIDE 20 MEQ PO PACK
40.0000 meq | PACK | ORAL | Status: DC
Start: 2020-10-20 — End: 2020-10-20

## 2020-10-20 MED ORDER — FREE WATER
200.0000 mL | Status: DC
  Administered 2020-10-20 – 2020-10-27 (×42): 200 mL

## 2020-10-20 MED ORDER — POTASSIUM CHLORIDE 20 MEQ PO PACK
40.0000 meq | PACK | Freq: Once | ORAL | Status: AC
  Administered 2020-10-20: 40 meq
  Filled 2020-10-20: qty 2

## 2020-10-20 MED ORDER — DEXTROSE 5 % IV SOLN: INTRAVENOUS | Status: DC

## 2020-10-20 MED ORDER — FREE WATER: 200.0000 mL | Status: DC

## 2020-10-20 MED ORDER — LABETALOL HCL 5 MG/ML IV SOLN: 10.0000 mg | INTRAVENOUS | Status: DC | PRN

## 2020-10-20 MED ORDER — POTASSIUM CHLORIDE 20 MEQ PO PACK
40.0000 meq | PACK | Freq: Once | ORAL | Status: AC
  Administered 2020-10-20: 40 meq
  Filled 2020-10-20 (×2): qty 2

## 2020-10-20 NOTE — Progress Notes (Addendum)
   10/20/20 0346  Provider Notification  Provider Name/Title Leafy Half, MD  Date Provider Notified 10/20/20  Time Provider Notified [phone]  Notification Type Page  Notification Reason Other (Comment) (CCMD notified of patient having 13 beat run of AIVR/Vtach)  Response No new orders   Note update at 0458:  Provider responded to page at 0452.  New orders received

## 2020-10-20 NOTE — Progress Notes (Signed)
   10/20/20 2149  Provider Notification  Provider Name/Title Leafy Half, [facility]  Date Provider Notified 10/20/20  Time Provider Notified 2135  Notification Type Page  Notification Reason Other (Comment) (CCMD advised that patient had a 6 beat run of VTach at 2039)  Response No new orders  Date of Provider Response 10/20/20  Time of Provider Response 2144

## 2020-10-20 NOTE — Progress Notes (Signed)
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 The patient has not been vaccinated against COVID-19.  He tested positive for COVID-19.  Chest x-ray showed patchy airspace disease.  Patient was hospitalized for further management.  Currently plan is monitor for improvement in oral intake and mentation  Assessment and Plan: 1. Acute Hypoxic respiratory failure, POA, 86 % on room air on admission.  Acute COVID-19 Viral Pneumonia CXR: hazy bilateral peripheral opacities Oxygen requirement: On 2 LPM 91% on room air. CRP: Trending down, 3.8 Remdesivir: Completed remdesivir on 1/24 Steroids: Currently on prednisone taper. Baricitinib/Actemra: Not indicated given concern with bacterial infection. Antibiotics: Started on 10/15/2020 for concern for aspiration pneumonia.  Currently coverage broadened to [place].  Last day of antibiotics 10/23/20 MRSA PCR negative.  Vancomycin canceled.  DVT Prophylaxis: enoxaparin (LOVENOX) injection 40 mg Start: 10/19/20 1000 Place and maintain sequential compression device Start: 10/15/20 1209  Prone positioning and incentive spirometer use recommended.  Overall plan: Prognosis guarded.  From Covid point of view patient appears stable but appears to have aspiration pneumonia.  Mentation stable.  Isolation duration: First positive test date 01/20, out of isolation 11/02/2020  The treatment plan and use of medications and known side effects were discussed with patient/family. It was clearly explained that  complete risks and long-term side effects are unknown. Patient/family agree with the treatment plan.    2.  Acute metabolic encephalopathy Severe B12 deficiency Hospital induced delirium. B12 undetectable. Mentation progressively worsened during the hospital stay. Suspected secondary to infection, COVID-19 as well as electrolyte abnormality. Patient was initiated on IV antibiotics. ABG did not show hypercapnia. CT of the head unremarkable for any acute stress as well. B1 level currently pending. Continue supplementation Ammonia, folic acid, TSH and free T4 level unremarkable. Seroquel added for agitation.  3.  Dysphagia Unable to swallow safely secondary to encephalopathy. Currently NPO. Discussed with family currently proceeding with cortrack. Discontinue IV fluid. Tolerating tube feeding. If the patient's mentation does not improve independence 48 to 72 hours inform family that patient consider comfort versus further aggressive care for the patient.  4.  Type 2 diabetes mellitus with hyperglycemia without any complication Hemoglobin A1c 6.4. Holding home regimen. Currently on sliding scale insulin.  Added Levemir  5.  Essential hypertension Blood pressure stable. As needed IV Lopressor  6.  Severe thrombocytopenia Likely in the setting of acute illness. H&H relatively stable but No schistocytes on the smear. Fibrinogen level 424, INR normal thus less likely hemolytic process. Now improving.  Will initiate DVT prophylaxis  7.  Sinus tachycardia. Currently stable. Monitor.  8.  GERD. Monitor  9.  Hypernatremia. Currently receiving free water Initially was on D5. Currently improving with reintroduction of D5.  10. Goals of care conversation. Extensive discussion with both daughters on the phone on 1/26. Patient's prognosis is very poor given his ongoing encephalopathy as well as difficulty swallowing. Appears to be suffering from COVID-19 pneumonia as well as  bacterial pneumonia. Severe thrombocytopenia likely in response to marrow suppression from severe infection. Outcome of the situation the  patient is in in the setting of a cardiac arrest is very poor.  Neurological recovery is less likely.  Based on this, after my recommendation family decided that patient should not be resuscitated or intubated in the setting of acute cardiac arrest/respiratory arrest.  11.  IV infiltration. Right upper extremity swollen after initiation of IV D5 again. We will check ultrasound Doppler to rule out DVT. Recommended RN to elevate upper extremity.  Diet: N.p.o. DVT Prophylaxis:   enoxaparin (LOVENOX) injection 40 mg Start: 10/19/20 1000 Place and maintain sequential compression device Start: 10/15/20 1209   Advance goals of care discussion: Limited code  Family Communication: no family was present at bedside, at the time of interview.  Discussed with daughter  Disposition:  Status is: Inpatient  Remains inpatient appropriate because:IV treatments appropriate due to intensity of illness or inability to take PO and Inpatient level of care appropriate due to severity of illness  Dispo:  Patient From: Home  Planned Disposition: Skilled Nursing Facility  Expected discharge date: 10/22/2020  Medically stable for discharge: No     Subjective: Remains confused and lethargic.  No nausea no vomiting.  Able to tell me his name.  No agitation.  Physical Exam:  General: Appear in mild distress, no Rash; Oral Mucosa Clear, dry. no Abnormal Neck Mass Or lumps, Conjunctiva normal  Cardiovascular: S1 and S2 Present, no Murmur, Respiratory: good respiratory effort, Bilateral Air entry present and bilateral crackles, no wheezes Abdomen: Bowel Sound present, Soft and no tenderness Extremities: no Pedal edema Neurology: alert and oriented to time, place, and person affect appropriate. no new focal deficit Gait not checked due to patient safety concerns  Vitals:    10/20/20 0333 10/20/20 0812 10/20/20 1300 10/20/20 1704  BP: (!) 151/64 113/85 (!) 122/55 (!) 116/94  Pulse: (!) 110 78 75 83  Resp: (!) 24 20 18 18   Temp: 99.4 F (37.4 C) 98.4 F (36.9 C) 99.2 F (37.3 C) 98.7 F (37.1 C)  TempSrc: Axillary Oral Axillary Axillary  SpO2: 94% 94% 95% 95%  Weight:      Height:        Intake/Output Summary (Last 24 hours) at 10/20/2020 1858 Last data filed at 10/20/2020 1722 Gross per 24 hour  Intake 650.03 ml  Output 1150 ml  Net -499.97 ml   Filed Weights   10/21/2020 1625 10/13/20 0457 10/17/20 0411  Weight: 79.4 kg 75.1 kg 74 kg    Data Reviewed: I have personally reviewed and interpreted daily labs, tele strips, imaging. I reviewed all nursing notes, pharmacy notes, vitals, pertinent old records I have discussed plan of care as described above with RN and patient/family.  CBC: Recent Labs  Lab 10/14/20 0301 10/15/20 0814 10/16/20 0307 10/17/20 0158 10/17/20 1226 10/18/20 0542 10/19/20 0551 10/20/20 0302  WBC 8.7 9.6 8.6 8.7  --  11.1* 12.3* 11.5*  NEUTROABS 6.4 8.0* 6.7  --   --   --   --   --   HGB 13.6 13.1 13.1 12.3*  --  12.0* 10.8* 10.2*  HCT 39.5 36.8* 37.8* 33.5*  --  33.2* 31.4* 28.5*  MCV 105.1* 104.5* 104.7* 104.7*  --  107.1* 108.7* 108.8*  PLT 128* 95* 87* 90* 83* 88* 101* 100*   Basic Metabolic Panel: Recent Labs  Lab 10/14/20 0301 10/15/20 0814 10/16/20 0307 10/17/20 0158 10/18/20 0542 10/19/20 0551 10/20/20 0302 10/20/20 0845 10/20/20 1704  NA 151*   < > 148*   < > 147* 149* 151* 149* 147*  K 3.1*   < > 3.6   < > 3.7 3.8 3.6 4.3 4.8  CL 112*   < > 112*   < > 112* 112* 113* 113* 109  CO2 24   < > 24   < > 26 29 29 30 30   GLUCOSE 144*   < > 262*   < > 274* 270* 224* 163* 287*  BUN 40*   < > 26*   < > 28* 22 19 18 18   CREATININE 1.28*   < > 1.28*   < > 1.01 0.88 0.85 0.81 0.83  CALCIUM 8.8*   < > 8.2*   < > 8.2* 9.1 9.4 9.3 9.3  MG 2.3  --  2.2  --   --   --  2.0  --   --    < > = values in this  interval not displayed.    Studies: ECHOCARDIOGRAM LIMITED  Result Date: 10/20/2020    ECHOCARDIOGRAM LIMITED REPORT   Patient Name:   Joseph Crawford Date of Exam: 10/20/2020 Medical Rec #:  [id number]           Height:       72.0 in Accession #:    [id number]          Weight:       163.1 lb Date of Birth:  1951-08-14           BSA:          1.954 m Patient Age:    69 years            BP:           113/85 mmHg Patient Gender: M                   HR:           82 bpm. Exam Location:  Inpatient Procedure: Limited Echo, Limited Color Doppler and Cardiac Doppler Indications:    Ventricular tachycardia  History:        Patient has no prior history of Echocardiogram examinations.                 Prior CABG, Covid; Risk Factors:Diabetes, Dyslipidemia and                 Hypertension.  Sonographer:    Delcie Roch Referring Phys: [id number] Kaidan Spengler M Clemon Devaul  Sonographer Comments: Image acquisition challenging due to uncooperative patient and Image acquisition challenging due to respiratory motion. IMPRESSIONS  1. Left ventricular ejection fraction, by estimation, is 65 to 70%. The left ventricle has normal function. The left ventricle has no regional wall motion abnormalities. Left ventricular diastolic parameters are consistent with Grade I diastolic dysfunction (impaired relaxation).  2. Left atrial size was mild to moderately dilated.  3. The mitral valve is normal in structure. No evidence of mitral valve regurgitation.  4. The aortic valve is grossly normal. Aortic valve regurgitation is not visualized. Mild aortic valve sclerosis is present, with no evidence of aortic valve stenosis.  5. Tricuspid regurgitation signal is inadequate for assessing PA pressure. FINDINGS  Left Ventricle: Left ventricular ejection fraction, by estimation, is 65 to 70%. The left ventricle has normal function. The left ventricle has no regional wall motion abnormalities. Left ventricular diastolic parameters are consistent with  Grade I diastolic dysfunction (impaired relaxation). Right Ventricle: Tricuspid regurgitation signal is inadequate for assessing PA pressure. Left Atrium: Left atrial size was mild to moderately dilated. Right Atrium: Right  atrial size was normal in size. Pericardium: There is no evidence of pericardial effusion. Mitral Valve: The mitral valve is normal in structure. Mild mitral annular calcification. Tricuspid Valve: The tricuspid valve is normal in structure. Tricuspid valve regurgitation is not demonstrated. Aortic Valve: The aortic valve is grossly normal. Aortic valve regurgitation is not visualized. Mild aortic valve sclerosis is present, with no evidence of aortic valve stenosis. Pulmonic Valve: The pulmonic valve was not well visualized. Pulmonic valve regurgitation is not visualized. IAS/Shunts: The interatrial septum was not well visualized. LEFT VENTRICLE PLAX 2D LVIDd:         5.10 cm LVIDs:         2.80 cm LV PW:         1.10 cm LV IVS:        1.00 cm LVOT diam:     2.20 cm LV SV:         73 LV SV Index:   37 LVOT Area:     3.80 cm  IVC IVC diam: 1.70 cm LEFT ATRIUM         Index LA diam:    4.60 cm 2.35 cm/m  AORTIC VALVE LVOT Vmax:   91.40 cm/s LVOT Vmean:  59.000 cm/s LVOT VTI:    0.191 m  AORTA Ao Root diam: 3.40 cm Ao Asc diam:  3.80 cm MITRAL VALVE MV Area (PHT): 3.91 cm    SHUNTS MV Decel Time: 194 msec    Systemic VTI:  0.19 m MV E velocity: 57.80 cm/s  Systemic Diam: 2.20 cm MV A velocity: 75.80 cm/s MV E/A ratio:  0.76 Mihai Croitoru MD Electronically signed by Thurmon Fair MD Signature Date/Time: 10/20/2020/1:30:35 PM    Final     Scheduled Meds: . chlorhexidine  15 mL Mouth Rinse BID  . cyanocobalamin  1,000 mcg Subcutaneous Q1200  . enoxaparin (LOVENOX) injection  40 mg Subcutaneous Daily  . folic acid  1 mg Per Tube Daily  . free water  200 mL Per Tube Q4H  . insulin aspart  0-15 Units Subcutaneous Q4H  . insulin detemir  5 Units Subcutaneous BID  . mouth rinse  15 mL Mouth  Rinse q12n4p  . multivitamin  15 mL Per Tube Daily  . predniSONE  20 mg Per Tube Q breakfast   Followed by  . [START ON 10/22/2020] predniSONE  10 mg Per Tube Q breakfast  . saccharomyces boulardii  250 mg Per Tube BID  . thiamine  100 mg Per Tube Daily   Continuous Infusions: . cefTAZidime (FORTAZ)  IV 2 g (10/20/20 1728)  . dextrose Stopped (10/20/20 1759)  . feeding supplement (JEVITY 1.2 CAL) 1,000 mL (10/19/20 1731)   PRN Meds: acetaminophen, labetalol, [DISCONTINUED] ondansetron **OR** ondansetron (ZOFRAN) IV  Time spent: 35 minutes  Author: Lynden Oxford, MD [facility] 10/20/2020 6:58 PM  To reach On-call, see care teams to locate the attending and reach out via [email or web address]. Between 7PM-7AM, please contact night-coverage If you still have difficulty reaching the attending provider, please page the [facility] (Director on Call) for [facility] on amion for assistance.

## 2020-10-20 NOTE — Social Work (Signed)
Pts Passr number is [phone] A. FL2 not completed due to pt having feeding tube.  Joseph Crawford, Theresia Majors, [place] Clinical Social Worker [phone]

## 2020-10-20 NOTE — Social Work (Signed)
CCSW attempted to speak with patient's daughter Misty Stanley about discharge planning and had to leave VM.

## 2020-10-20 NOTE — TOC Progression Note (Signed)
Transition of Care [facility]) - Progression Note    Patient Details  Name: Joseph Crawford MRN: [id number] Date of Birth: [birth year]  [facility] CM/SW Contact  Patrice [place], [place] Phone Number: [phone] 10/20/2020, 3:48 PM  Clinical Narrative:     CSW spoke with patient's daughter Misty Stanley and she is agreeable to SNF. She is requesting that patient's referral only be sent to [facility] and [facility] in [place] when pt is ready to be faxed out. Patient is not vaccinated.  TOC team will continue to assist with discharge planning needs.  Expected Discharge Plan: Skilled Nursing Facility Barriers to Discharge: Continued Medical Work up  Expected Discharge Plan and Services Expected Discharge Plan: Skilled Nursing Facility   Discharge Planning Services: CM Consult Post Acute Care Choice: Home Health Living arrangements for the past 2 months: Single Family Home                 DME Arranged: N/A         HH Arranged: PT HH Agency: [facility] Date HH Agency Contacted: 10/15/20 Time HH Agency Contacted: 1327 Representative spoke with at [facility] Agency: [facility]   Social Determinants of Health (SDOH) Interventions    Readmission Risk Interventions No flowsheet data found.

## 2020-10-20 NOTE — Progress Notes (Signed)
  Echocardiogram 2D Echocardiogram has been performed.  Delcie Roch 10/20/2020, 11:24 AM

## 2020-10-21 ENCOUNTER — Inpatient Hospital Stay (HOSPITAL_COMMUNITY): Payer: Medicare Other

## 2020-10-21 DIAGNOSIS — R609 Edema, unspecified: Secondary | ICD-10-CM | POA: Diagnosis not present

## 2020-10-21 DIAGNOSIS — U071 COVID-19: Secondary | ICD-10-CM

## 2020-10-21 LAB — BASIC METABOLIC PANEL
Anion gap: 6 (ref 5–15)
Anion gap: 5 (ref 5–15)
BUN: 16 mg/dL (ref 8–23)
BUN: 16 mg/dL (ref 8–23)
CO2: 33 mmol/L — ABNORMAL HIGH (ref 22–32)
CO2: 32 mmol/L (ref 22–32)
Calcium: 9.4 mg/dL (ref 8.9–10.3)
Calcium: 9.4 mg/dL (ref 8.9–10.3)
Chloride: 105 mmol/L (ref 98–111)
Chloride: 105 mmol/L (ref 98–111)
Creatinine, Ser: 0.77 mg/dL (ref 0.61–1.24)
Creatinine, Ser: 0.73 mg/dL (ref 0.61–1.24)
GFR, Estimated: >60 mL/min (ref >60)
GFR, Estimated: >60 mL/min (ref >60)
Glucose, Bld: 284 mg/dL — ABNORMAL HIGH (ref 70–99)
Glucose, Bld: 275 mg/dL — ABNORMAL HIGH (ref 70–99)
Potassium: 3.9 mmol/L (ref 3.5–5.1)
Potassium: 4.4 mmol/L (ref 3.5–5.1)
Sodium: 144 mmol/L (ref 135–145)
Sodium: 142 mmol/L (ref 135–145)

## 2020-10-21 LAB — CBC
HCT: 30.1 % — ABNORMAL LOW (ref 39.0–52.0)
Hemoglobin: 10.2 g/dL — ABNORMAL LOW (ref 13.0–17.0)
MCH: 36.7 pg — ABNORMAL HIGH (ref 26.0–34.0)
MCHC: 33.9 g/dL (ref 30.0–36.0)
MCV: 108.3 fL — ABNORMAL HIGH (ref 80.0–100.0)
Platelets: 125 10*3/uL — ABNORMAL LOW (ref 150–400)
RBC: 2.78 MIL/uL — ABNORMAL LOW (ref 4.22–5.81)
RDW: 18.5 % — ABNORMAL HIGH (ref 11.5–15.5)
WBC: 10.6 10*3/uL — ABNORMAL HIGH (ref 4.0–10.5)
nRBC: 0.7 % — ABNORMAL HIGH (ref 0.0–0.2)

## 2020-10-21 LAB — C-REACTIVE PROTEIN: CRP: 2.1 mg/dL — ABNORMAL HIGH (ref <1.0)

## 2020-10-21 LAB — GLUCOSE, CAPILLARY
Glucose-Capillary: 197 mg/dL — ABNORMAL HIGH (ref 70–99)
Glucose-Capillary: 242 mg/dL — ABNORMAL HIGH (ref 70–99)
Glucose-Capillary: 246 mg/dL — ABNORMAL HIGH (ref 70–99)
Glucose-Capillary: 252 mg/dL — ABNORMAL HIGH (ref 70–99)
Glucose-Capillary: 256 mg/dL — ABNORMAL HIGH (ref 70–99)
Glucose-Capillary: 262 mg/dL — ABNORMAL HIGH (ref 70–99)

## 2020-10-21 NOTE — Progress Notes (Signed)
Noted on monitor that pt's HR dropped to the 40s, EKG done, Allena Katz, MD notified.  MD at bedside assessing pt, ordered to notify again if pt has a change in his rhythm/ mentation. Oncoming RN updated.

## 2020-10-21 NOTE — Progress Notes (Signed)
[facility] Progress Note  Patient: Joseph Crawford    ERD:[id number]  DOA: 10/16/2020     Date of Service: the patient was seen and examined on 10/21/2020  Brief hospital course: 70 y.o.Caucasian malewith a known history of type 2 diabetes mellitus, coronary artery disease status post four-vessel CABG, dyslipidemia, hypertension and ongoing tobacco abuse, who presented to the emergency room with acute onset of worsening dyspnea with associated dry cough as well as fever and chills for the last week. The patient has not been vaccinated against COVID-19.  He tested positive for COVID-19.  Chest x-ray showed patchy airspace disease.  Patient was hospitalized for further management.  Currently plan is monitor for improvement in oral intake and mentation continue to engage with family regarding goals of care as the patient is not making his enough progress in his mentation  Assessment and Plan: 1. Acute Hypoxic respiratory failure, POA, 86 % on room air on admission.  Acute COVID-19 Viral Pneumonia CXR: hazy bilateral peripheral opacities Oxygen requirement: On 2 LPM 96%.  CRP: Trending down, 3.8 Remdesivir: Completed remdesivir on 1/24 Steroids: Currently on prednisone taper. Baricitinib/Actemra: Not indicated given concern with bacterial infection. Antibiotics: Started on 10/15/2020 for concern for aspiration pneumonia. Currently coverage broadened to [place]. Last day of antibiotics 10/23/20 MRSA PCR negative.  Vancomycin canceled.  DVT Prophylaxis: enoxaparin (LOVENOX) injection 40 mg Start: 10/19/20 1000 Place and maintain sequential compression device Start: 10/15/20 1209  Overall plan: Prognosis very poor. From Covid point of view patient appears stable but appears to have aspiration pneumonia.  Mentation showing no improvement in last 72 hours  Isolation duration: First positive test date 01/20, out of isolation 11/02/2020  The treatment plan and use of medications and known  side effects were discussed with patient/family. It was clearly explained that complete risks and long-term side effects are unknown. Patient/family agree with the treatment plan.    2.  Acute metabolic encephalopathy Severe B12 deficiency Hospital induced delirium. B12 undetectable. Mentation progressively worsened during the hospital stay. Suspected secondary to infection, COVID-19 as well as electrolyte abnormality. Patient was initiated on IV antibiotics. ABG did not show hypercapnia. CT of the head unremarkable for any acute stress as well. B1 level currently pending. Continue supplementation Ammonia, folic acid, TSH and free T4 level unremarkable. Seroquel was briefly added but currently discontinued.  3.  Dysphagia Unable to swallow safely secondary to encephalopathy. Currently NPO. Discussed with family currently proceeding with cortrack. Intermittently receiving IV fluid. Tolerating tube feeding. Mentation not improving. Discussed with family regarding goals of care.  They are currently leaning towards comfort care with residential hospice. Given that the patient is unable to tolerate anything p.o. and there is no meaningful improvement in patient's mentation despite 72 hours of tube feeding currently he will meet criteria for residential hospice once transition to comfort care.  4.  Type 2 diabetes mellitus with hyperglycemia without any complication Hemoglobin A1c 6.4. Holding home regimen. Currently on sliding scale insulin.  Added Levemir  5.  Essential hypertension Blood pressure stable. As needed IV Lopressor  6.  Severe thrombocytopenia Likely in the setting of acute illness. H&H relatively stable but No schistocytes on the smear. Fibrinogen level 424, INR normal thus less likely hemolytic process. Now improving.  Will initiate DVT prophylaxis  7.  Sinus tachycardia. Currently stable. Monitor.  8.  GERD. Monitor  9.  Hypernatremia. Currently receiving  free water Initially was on D5. Currently improving with reintroduction of D5.  10. Goals of care  conversation. Extensive discussion with both daughters on the phone on 1/26. Patient's prognosis is very poor given his ongoing encephalopathy as well as difficulty swallowing. Appears to be suffering from COVID-19 pneumonia as well as bacterial pneumonia. Severe thrombocytopenia likely in response to marrow suppression from severe infection. Outcome of the situation the patient is in in the setting of a cardiac arrest is very poor.  Neurological recovery is less likely.  Based on this, after my recommendation family decided that patient should not be resuscitated or intubated in the setting of acute cardiac arrest/respiratory arrest.  11.  IV infiltration. Right upper extremity SVT Right upper extremity swollen after initiation of IV D5 again. Doppler is positive for SVT.  No evidence of DVT. Recommended RN to elevate upper extremity.  Diet: N.p.o. DVT Prophylaxis:   enoxaparin (LOVENOX) injection 40 mg Start: 10/19/20 1000 Place and maintain sequential compression device Start: 10/15/20 1209   Advance goals of care discussion: Limited code  Family Communication: no family was present at bedside, at the time of interview.  Discussed with daughter, allowed them to visit given patient's poor prognosis.  Disposition:  Status is: Inpatient  Remains inpatient appropriate because:IV treatments appropriate due to intensity of illness or inability to take PO and Inpatient level of care appropriate due to severity of illness  Dispo:  Patient From: Home  Planned Disposition: Skilled Nursing Facility  Expected discharge date: 10/26/2020  Medically stable for discharge: No     Subjective: Remains confused.  No nausea no vomiting.  No fever no chills.  Delirious mumbling.  Physical Exam:  General: Appear in mild distress, no Rash; Oral Mucosa Clear, dry. no Abnormal Neck Mass Or lumps,  Conjunctiva normal  Cardiovascular: S1 and S2 Present, no Murmur, Respiratory: increased respiratory effort, Bilateral Air entry present and bilateral  Crackles, no wheezes Abdomen: Bowel Sound present, Soft and no tenderness Extremities: trace Pedal edema Neurology: alert and not oriented to time, place, and person affect flat. no new focal deficit Gait not checked due to patient safety concerns  Vitals:   10/21/20 0846 10/21/20 1313 10/21/20 1314 10/21/20 1805  BP: (!) 114/56 111/61  (!) 134/59  Pulse: 80 70 69 77  Resp: (!) 21 19 (!) 21 19  Temp: 98.3 F (36.8 C)  97.9 F (36.6 C) 98.5 F (36.9 C)  TempSrc: Axillary   Axillary  SpO2: 95% 95% 94% 97%  Weight:      Height:        Intake/Output Summary (Last 24 hours) at 10/21/2020 1826 Last data filed at 10/21/2020 0410 Gross per 24 hour  Intake 1700 ml  Output 250 ml  Net 1450 ml   Filed Weights   10/13/20 0457 10/17/20 0411 10/21/20 0437  Weight: 75.1 kg 74 kg 80.4 kg    Data Reviewed: I have personally reviewed and interpreted daily labs, tele strips, imaging. I reviewed all nursing notes, pharmacy notes, vitals, pertinent old records I have discussed plan of care as described above with RN and patient/family.  CBC: Recent Labs  Lab 10/15/20 0814 10/16/20 0307 10/17/20 0158 10/17/20 1226 10/18/20 0542 10/19/20 0551 10/20/20 0302 10/21/20 0456  WBC 9.6 8.6 8.7  --  11.1* 12.3* 11.5* 10.6*  NEUTROABS 8.0* 6.7  --   --   --   --   --   --   HGB 13.1 13.1 12.3*  --  12.0* 10.8* 10.2* 10.2*  HCT 36.8* 37.8* 33.5*  --  33.2* 31.4* 28.5* 30.1*  MCV 104.5* 104.7*  104.7*  --  107.1* 108.7* 108.8* 108.3*  PLT 95* 87* 90* 83* 88* 101* 100* 125*   Basic Metabolic Panel: Recent Labs  Lab 10/16/20 0307 10/17/20 0158 10/20/20 0302 10/20/20 0845 10/20/20 1704 10/21/20 0456 10/21/20 1701  NA 148*   < > 151* 149* 147* 144 142  K 3.6   < > 3.6 4.3 4.8 3.9 4.4  CL 112*   < > 113* 113* 109 105 105  CO2 24   < > 29  30 30  33* 32  GLUCOSE 262*   < > 224* 163* 287* 284* 275*  BUN 26*   < > 19 18 18 16 16   CREATININE 1.28*   < > 0.85 0.81 0.83 0.77 0.73  CALCIUM 8.2*   < > 9.4 9.3 9.3 9.4 9.4  MG 2.2  --  2.0  --   --   --   --    < > = values in this interval not displayed.    Studies: VAS [place] UPPER EXTREMITY VENOUS DUPLEX  Result Date: 10/21/2020 UPPER VENOUS STUDY  Indications: Edema, and Covid-19 Comparison Study: No prior study Performing Technologist: Sherren Kerns RVS  Examination Guidelines: A complete evaluation includes B-mode imaging, spectral Doppler, color Doppler, and power Doppler as needed of all accessible portions of each vessel. Bilateral testing is considered an integral part of a complete examination. Limited examinations for reoccurring indications may be performed as noted.  Right Findings: +----------+------------+---------+-----------+----------+---------------+ RIGHT     CompressiblePhasicitySpontaneousProperties    Summary     +----------+------------+---------+-----------+----------+---------------+ IJV                      Yes       Yes                              +----------+------------+---------+-----------+----------+---------------+ Subclavian    Full       Yes       Yes                              +----------+------------+---------+-----------+----------+---------------+ Axillary                 Yes       Yes                              +----------+------------+---------+-----------+----------+---------------+ Brachial      Full       Yes       Yes                              +----------+------------+---------+-----------+----------+---------------+ Radial                                              patent by color +----------+------------+---------+-----------+----------+---------------+ Ulnar                                               patent by color +----------+------------+---------+-----------+----------+---------------+  Cephalic      None                                                  +----------+------------+---------+-----------+----------+---------------+  Basilic       Full                                                  +----------+------------+---------+-----------+----------+---------------+  Left Findings: +----------+------------+---------+-----------+----------+-------+ LEFT      CompressiblePhasicitySpontaneousPropertiesSummary +----------+------------+---------+-----------+----------+-------+ Subclavian               Yes       Yes                      +----------+------------+---------+-----------+----------+-------+  Summary:  Right: No evidence of deep vein thrombosis in the upper extremity. Findings consistent with acute superficial vein thrombosis involving the right cephalic vein.  Left: No evidence of thrombosis in the subclavian.  *See table(s) above for measurements and observations.  Diagnosing physician: Heath Lark Electronically signed by Heath Lark on 10/21/2020 at 5:55:10 PM.    Final     Scheduled Meds: . chlorhexidine  15 mL Mouth Rinse BID  . cyanocobalamin  1,000 mcg Subcutaneous Q1200  . enoxaparin (LOVENOX) injection  40 mg Subcutaneous Daily  . folic acid  1 mg Per Tube Daily  . free water  200 mL Per Tube Q4H  . insulin aspart  0-15 Units Subcutaneous Q4H  . insulin detemir  5 Units Subcutaneous BID  . mouth rinse  15 mL Mouth Rinse q12n4p  . multivitamin  15 mL Per Tube Daily  . [START ON 10/22/2020] predniSONE  10 mg Per Tube Q breakfast  . saccharomyces boulardii  250 mg Per Tube BID  . thiamine  100 mg Per Tube Daily   Continuous Infusions: . cefTAZidime (FORTAZ)  IV 2 g (10/21/20 1803)  . feeding supplement (JEVITY 1.2 CAL) 1,000 mL (10/21/20 1812)   PRN Meds: acetaminophen, labetalol, [DISCONTINUED] ondansetron **OR** ondansetron (ZOFRAN) IV  Time spent: 35 minutes  Author: Lynden Oxford, MD [facility] 10/21/2020 6:26 PM  To  reach On-call, see care teams to locate the attending and reach out via [email or web address]. Between 7PM-7AM, please contact night-coverage If you still have difficulty reaching the attending provider, please page the [facility] (Director on Call) for [facility] on amion for assistance.

## 2020-10-22 ENCOUNTER — Inpatient Hospital Stay (HOSPITAL_COMMUNITY): Payer: Medicare Other

## 2020-10-22 ENCOUNTER — Encounter (HOSPITAL_COMMUNITY): Payer: Self-pay | Admitting: Family Medicine

## 2020-10-22 DIAGNOSIS — R4182 Altered mental status, unspecified: Secondary | ICD-10-CM

## 2020-10-22 DIAGNOSIS — U071 COVID-19: Secondary | ICD-10-CM | POA: Diagnosis not present

## 2020-10-22 DIAGNOSIS — J9601 Acute respiratory failure with hypoxia: Secondary | ICD-10-CM | POA: Diagnosis not present

## 2020-10-22 LAB — GLUCOSE, CAPILLARY
Glucose-Capillary: 163 mg/dL — ABNORMAL HIGH (ref 70–99)
Glucose-Capillary: 201 mg/dL — ABNORMAL HIGH (ref 70–99)
Glucose-Capillary: 209 mg/dL — ABNORMAL HIGH (ref 70–99)
Glucose-Capillary: 224 mg/dL — ABNORMAL HIGH (ref 70–99)
Glucose-Capillary: 248 mg/dL — ABNORMAL HIGH (ref 70–99)
Glucose-Capillary: 262 mg/dL — ABNORMAL HIGH (ref 70–99)

## 2020-10-22 LAB — BASIC METABOLIC PANEL
Anion gap: 7 (ref 5–15)
BUN: 17 mg/dL (ref 8–23)
CO2: 31 mmol/L (ref 22–32)
Calcium: 9.7 mg/dL (ref 8.9–10.3)
Chloride: 105 mmol/L (ref 98–111)
Creatinine, Ser: 0.75 mg/dL (ref 0.61–1.24)
GFR, Estimated: >60 mL/min (ref >60)
Glucose, Bld: 243 mg/dL — ABNORMAL HIGH (ref 70–99)
Potassium: 3.7 mmol/L (ref 3.5–5.1)
Sodium: 143 mmol/L (ref 135–145)

## 2020-10-22 LAB — C-REACTIVE PROTEIN: CRP: 1.2 mg/dL — ABNORMAL HIGH (ref <1.0)

## 2020-10-22 NOTE — Progress Notes (Signed)
EEG completed, results pending. 

## 2020-10-22 NOTE — Procedures (Signed)
Patient Name: Joseph Crawford  MRN: [id number]  Epilepsy Attending: Charlsie Quest  Referring Physician/Provider: Dr Lynden Oxford Date: 1/31/202  Duration: 23.46 mins  Patient history: 70yo M with ams. EEG to evaluate for seizure  Level of alertness: Awake  AEDs during EEG study: None  Technical aspects: This EEG study was done with scalp electrodes positioned according to the 10-20 International system of electrode placement. Electrical activity was acquired at a sampling rate of 500Hz  and reviewed with a high frequency filter of 70Hz  and a low frequency filter of 1Hz . EEG data were recorded continuously and digitally stored.   Description: No posterior dominant rhythm was seen. EEG showed continuous generalized 3 to 6 Hz theta-delta slowing. Hyperventilation and photic stimulation were not performed.     ABNORMALITY -Continuous slow, generalized  IMPRESSION: This study is suggestive of moderate diffuse encephalopathy, nonspecific etiology. No seizures or epileptiform discharges were seen throughout the recording.  Ronit Marczak 

## 2020-10-22 NOTE — Plan of Care (Signed)

## 2020-10-22 NOTE — Progress Notes (Addendum)
Inpatient Diabetes Program Recommendations  AACE/ADA: New Consensus Statement on Inpatient Glycemic Control (2015)  Target Ranges:  Prepandial:   less than 140 mg/dL      Peak postprandial:   less than 180 mg/dL (1-2 hours)      Critically ill patients:  140 - 180 mg/dL   Results for Joseph Crawford, Joseph Crawford (MRN [id number]) as of 10/22/2020 10:32  Ref. Range 10/21/2020 00:02 10/21/2020 04:27 10/21/2020 08:42 10/21/2020 12:55 10/21/2020 18:04 10/21/2020 19:48  Glucose-Capillary Latest Ref Range: 70 - 99 mg/dL 284 (H) 132 (H) 440 (H) 246 (H) 256 (H) 242 (H)   Results for Joseph Crawford, Joseph Crawford (MRN [id number]) as of 10/22/2020 10:32  Ref. Range 10/22/2020 00:39 10/22/2020 04:13 10/22/2020 08:02  Glucose-Capillary Latest Ref Range: 70 - 99 mg/dL 440 (H)  5 units NOVOLOG  209 (H)  5 units NOVOLOG  224 (H)  5 units NOVOLOG       Home DM Meds: Metformin 850 mg BID  Current Orders: Levemir 5 units BID      Novolog Moderate Correction Scale/ SSI (0-15 units) Q4 hours     Prednisone 10 mg Daily  Jevity tube feedings 75cc/hr   MD- Note CBGs consistently >200.  Please consider adding Novolog Tube Feed Coverage:  Novolog 4 units Q4 hours (give in addition to the ordered SSI)  Please HOLD if tube feeds HELD for any reason    --Will follow patient during hospitalization--  Ambrose Finland RN, MSN, CDE Diabetes Coordinator Inpatient Glycemic Control Team Team Pager: [phone] (8a-5p)

## 2020-10-22 NOTE — Care Management Important Message (Signed)
Important Message  Patient Details  Name: Joseph Crawford MRN: [id number] Date of Birth: 09-24-1950   Medicare Important Message Given:  Yes pt. On Airborne and contact precautions. Vernona Rieger RN gave letter to pt.     [name] 10/22/2020, 4:34 PM

## 2020-10-22 NOTE — Progress Notes (Signed)
  Speech Language Pathology Treatment: Dysphagia  Patient Details Name: Joseph Crawford MRN: [id number] DOB: 1951/05/06 Today's Date: 10/22/2020 Time: 5885-0277 SLP Time Calculation (min) (ACUTE ONLY): 16 min  Assessment / Plan / Recommendation Clinical Impression  Pt demonstrates ongoing lethargy. He is minimally responsive to face washing, oral care. He verbalized yes to wanting ice, but could not orally manipulate it. At this time, pt is not progressing. Will sign off. Please reorder if needed.   HPI HPI: 70 y.o. 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  The patient has not been vaccinated against COVID-19.  He tested positive for COVID-19.  Chest x-ray showed patchy airspace disease.  Patient was hospitalized for further management.      SLP Plan  Discharge SLP treatment due to (comment)       Recommendations  Diet recommendations: NPO;Other(comment) (comfort ffeding if alert)                Oral Care Recommendations: Oral care QID Follow up Recommendations: Skilled Nursing facility Plan: Discharge SLP treatment due to (comment)       GO               Harlon Ditty, MA CCC-SLP  Acute Rehabilitation Services Pager [phone] Office [phone]  Claudine Mouton 10/22/2020, 1:45 PM

## 2020-10-22 NOTE — Progress Notes (Signed)
[facility] Progress Note  Patient: Joseph Crawford    ATF:[id number]  DOA: Oct 31, 2020     Date of Service: the patient was seen and examined on 10/22/2020  Brief hospital course: 70 y.o.Caucasian malewith a known history of type 2 diabetes mellitus, coronary artery disease status post four-vessel CABG, dyslipidemia, hypertension and ongoing tobacco abuse, who presented to the emergency room with acute onset of worsening dyspnea with associated dry cough as well as fever and chills for the last week. The patient has not been vaccinated against COVID-19.  He tested positive for COVID-19.  Chest x-ray showed patchy airspace disease.  Patient was hospitalized for further management.  Currently plan is continue to engage with the family regarding goals of care, monitor for improvement in mentation.  Assessment and Plan: 1. Acute Hypoxic respiratory failure, POA, 86 % on room air on admission.  Acute COVID-19 Viral Pneumonia CXR: hazy bilateral peripheral opacities Oxygen requirement: On room air CRP: Trending down, 1.2 Remdesivir: Completed remdesivir on 1/24 Steroids: Currently on prednisone taper. Baricitinib/Actemra: Not indicated given concern with bacterial infection. Antibiotics: Started on Unasyn, coverage broadened to [place].  Currently completed both antibiotics. MRSA PCR negative.  Vancomycin canceled.  DVT Prophylaxis: enoxaparin (LOVENOX) injection 40 mg Start: 10/19/20 1000 Place and maintain sequential compression device Start: 10/15/20 1209  Overall plan: Prognosis very poor. From Covid point of view patient appears stable  Isolation duration: First positive test date 01/20, out of isolation 11/02/2020  The treatment plan and use of medications and known side effects were discussed with patient/family. It was clearly explained that complete risks and long-term side effects are unknown. Patient/family agree with the treatment plan.    2.  Acute metabolic  encephalopathy Severe B12 deficiency Hospital induced delirium. B12 undetectable. Mentation progressively worsened during the hospital stay. Per family at baseline patient is alert awake and oriented x4, takes care of his medicines, groceries, drives a car. Suspected secondary to infection, COVID-19 as well as electrolyte abnormality. Patient was initiated on IV antibiotics. ABG did not show hypercapnia. CT of the head unremarkable for any acute stress as well. B1 level currently pending. Continue supplementation Ammonia, folic acid, TSH and free T4 level unremarkable. Seroquel was briefly added but currently discontinued. EEG negative for any acute abnormality. Per family patient was able to converse with them, identify the daughters and follow range of motion activities with them. We will consult with neurology.  3.  Dysphagia Unable to swallow safely secondary to encephalopathy. Currently NPO. Discussed with family currently proceeding with cortrack. Intermittently receiving IV fluid. Tolerating tube feeding. Mentation not improving. Discussed with family regarding goals of care. Speech therapy unable to work with the patient and therefore currently signed off.  Will reconsult when the patient is improving mentation wise.  4.  Type 2 diabetes mellitus with hyperglycemia without any complication Hemoglobin A1c 6.4. Holding home regimen. Currently on sliding scale insulin.  Added Levemir  5.  Essential hypertension Blood pressure stable. As needed IV Lopressor  6.  Severe thrombocytopenia Likely consumption secondary to SVT in the setting of acute illness. H&H relatively stable but No schistocytes on the smear. Fibrinogen level 424, INR normal thus less likely hemolytic process. Currently improving.  7.  Sinus tachycardia. Currently stable. Monitor.  8.  GERD. Monitor  9.  Hypernatremia. Currently receiving free water intermittent was on D5. Now normal.   Continue free water.  IV fluids stopped.  10. Goals of care conversation. Extensive discussion with both daughters on the phone  on 1/26. Also on 1/31 and 1/30. Patient's prognosis is very poor given his ongoing encephalopathy as well as difficulty swallowing. Appears to be suffering from COVID-19 pneumonia as well as bacterial pneumonia. Severe thrombocytopenia likely in response to marrow suppression from severe infection. Outcome of the situation the patient is in in the setting of a cardiac arrest is very poor.  Neurological recovery is less likely.  Based on this, after my recommendation family decided that patient should not be resuscitated or intubated in the setting of acute cardiac arrest/respiratory arrest.  11.  IV infiltration. Right upper extremity SVT Right upper extremity swollen after initiation of IV D5 again. Doppler is positive for SVT.  No evidence of DVT. Recommended RN to elevate upper extremity.  12.  Sinus bradycardia. PVCs, frequent NSVT Noted on telemetry on 1/30.  No further bradycardia. Ongoing PVCs. Echocardiogram showed preserved EF without any acute abnormality. Currently holding off on introducing scheduled Lopressor given his bradycardic events.  Once stable introduce Lopressor protein. Monitor.  Diet: N.p.o. DVT Prophylaxis:   enoxaparin (LOVENOX) injection 40 mg Start: 10/19/20 1000 Place and maintain sequential compression device Start: 10/15/20 1209   Advance goals of care discussion: Limited code  Family Communication: no family was present at bedside, at the time of interview.  Discussed with daughter, allowed them to visit given patient's poor prognosis.  Disposition:  Status is: Inpatient  Remains inpatient appropriate because:IV treatments appropriate due to intensity of illness or inability to take PO and Inpatient level of care appropriate due to severity of illness  Dispo:  Patient From: Home  Planned Disposition: Skilled Nursing  Facility  Expected discharge date: 10/25/2020  Medically stable for discharge: No     Subjective: Able to tell me his name but unable to follow commands.  No nausea no vomiting.  No acute events overnight.  Remains delirious.  Physical Exam:  General: Appear in mild distress, no Rash; Oral Mucosa Clear, dry. no Abnormal Neck Mass Or lumps, Conjunctiva normal  Cardiovascular: S1 and S2 Present, no Murmur, Respiratory: Normal respiratory effort, Bilateral Air entry present and bilateral  Crackles, no wheezes Abdomen: Bowel Sound present, Soft and no tenderness Extremities: trace Pedal edema Neurology: alert and oriented to person, not oriented to time, place. affect flat. no new focal deficit Gait not checked due to patient safety concerns   Vitals:   10/22/20 0500 10/22/20 0804 10/22/20 1237 10/22/20 1658  BP:  122/60 (!) 145/94 114/68  Pulse:  74 72 73  Resp:  (!) 21 (!) 23 20  Temp:  98.8 F (37.1 C) 97.8 F (36.6 C) 98.3 F (36.8 C)  TempSrc:  Axillary Axillary Oral  SpO2:  92% 90% 90%  Weight: 81.1 kg     Height:        Intake/Output Summary (Last 24 hours) at 10/22/2020 2013 Last data filed at 10/22/2020 1257 Gross per 24 hour  Intake 1886.25 ml  Output 900 ml  Net 986.25 ml   Filed Weights   10/17/20 0411 10/21/20 0437 10/22/20 0500  Weight: 74 kg 80.4 kg 81.1 kg    Data Reviewed: I have personally reviewed and interpreted daily labs, tele strips, imaging. I reviewed all nursing notes, pharmacy notes, vitals, pertinent old records I have discussed plan of care as described above with RN and patient/family.  CBC: Recent Labs  Lab 10/16/20 0307 10/17/20 0158 10/17/20 1226 10/18/20 0542 10/19/20 0551 10/20/20 0302 10/21/20 0456  WBC 8.6 8.7  --  11.1* 12.3* 11.5* 10.6*  NEUTROABS 6.7  --   --   --   --   --   --   HGB 13.1 12.3*  --  12.0* 10.8* 10.2* 10.2*  HCT 37.8* 33.5*  --  33.2* 31.4* 28.5* 30.1*  MCV 104.7* 104.7*  --  107.1* 108.7* 108.8* 108.3*   PLT 87* 90* 83* 88* 101* 100* 125*   Basic Metabolic Panel: Recent Labs  Lab 10/16/20 0307 10/17/20 0158 10/20/20 0302 10/20/20 0845 10/20/20 1704 10/21/20 0456 10/21/20 1701 10-23-20 0613  NA 148*   < > 151* 149* 147* 144 142 143  K 3.6   < > 3.6 4.3 4.8 3.9 4.4 3.7  CL 112*   < > 113* 113* 109 105 105 105  CO2 24   < > 29 30 30  33* 32 31  GLUCOSE 262*   < > 224* 163* 287* 284* 275* 243*  BUN 26*   < > 19 18 18 16 16 17   CREATININE 1.28*   < > 0.85 0.81 0.83 0.77 0.73 0.75  CALCIUM 8.2*   < > 9.4 9.3 9.3 9.4 9.4 9.7  MG 2.2  --  2.0  --   --   --   --   --    < > = values in this interval not displayed.    Studies: EEG adult  Result Date: 2020/10/23 , MD     October 23, 2020  2:27 PM Patient Name: Bralon Antkowiak MRN: 10/24/2020 Epilepsy Attending: Lequita Asal Referring Physician/Provider: Dr [id number] Date: 1/31/202 Duration: 23.46 mins Patient history: 70yo M with ams. EEG to evaluate for seizure Level of alertness: Awake AEDs during EEG study: None Technical aspects: This EEG study was done with scalp electrodes positioned according to the 10-20 International system of electrode placement. Electrical activity was acquired at a sampling rate of 500Hz  and reviewed with a high frequency filter of 70Hz  and a low frequency filter of 1Hz . EEG data were recorded continuously and digitally stored. Description: No posterior dominant rhythm was seen. EEG showed continuous generalized 3 to 6 Hz theta-delta slowing. Hyperventilation and photic stimulation were not performed.   ABNORMALITY -Continuous slow, generalized IMPRESSION: This study is suggestive of moderate diffuse encephalopathy, nonspecific etiology. No seizures or epileptiform discharges were seen throughout the recording. Priyanka 11-[birth year]2000    Scheduled Meds: . chlorhexidine  15 mL Mouth Rinse BID  . cyanocobalamin  1,000 mcg Subcutaneous Q1200  . enoxaparin (LOVENOX) injection  40 mg Subcutaneous Daily  .  folic acid  1 mg Per Tube Daily  . free water  200 mL Per Tube Q4H  . insulin aspart  0-15 Units Subcutaneous Q4H  . insulin detemir  5 Units Subcutaneous BID  . mouth rinse  15 mL Mouth Rinse q12n4p  . multivitamin  15 mL Per Tube Daily  . predniSONE  10 mg Per Tube Q breakfast  . saccharomyces boulardii  250 mg Per Tube BID  . thiamine  100 mg Per Tube Daily   Continuous Infusions: . feeding supplement (JEVITY 1.2 CAL) 1,000 mL (10/21/20 1812)   PRN Meds: acetaminophen, [DISCONTINUED] ondansetron **OR** ondansetron (ZOFRAN) IV  Time spent: 35 minutes  Author: , MD [facility] 10-23-20 8:13 PM  To reach On-call, see care teams to locate the attending and reach out via www. . Between 7PM-7AM, please contact night-coverage If you still have difficulty reaching the attending provider, please page the [facility] (Director on Call) for [facility] on amion for assistance.

## 2020-10-23 ENCOUNTER — Encounter (HOSPITAL_COMMUNITY): Payer: Self-pay | Admitting: Family Medicine

## 2020-10-23 DIAGNOSIS — U071 COVID-19: Secondary | ICD-10-CM | POA: Diagnosis not present

## 2020-10-23 LAB — BASIC METABOLIC PANEL
Anion gap: 7 (ref 5–15)
BUN: 17 mg/dL (ref 8–23)
CO2: 32 mmol/L (ref 22–32)
Calcium: 9.8 mg/dL (ref 8.9–10.3)
Chloride: 104 mmol/L (ref 98–111)
Creatinine, Ser: 0.81 mg/dL (ref 0.61–1.24)
GFR, Estimated: >60 mL/min (ref >60)
Glucose, Bld: 223 mg/dL — ABNORMAL HIGH (ref 70–99)
Potassium: 3.8 mmol/L (ref 3.5–5.1)
Sodium: 143 mmol/L (ref 135–145)

## 2020-10-23 LAB — CBC
HCT: 28.3 % — ABNORMAL LOW (ref 39.0–52.0)
Hemoglobin: 9.5 g/dL — ABNORMAL LOW (ref 13.0–17.0)
MCH: 36.1 pg — ABNORMAL HIGH (ref 26.0–34.0)
MCHC: 33.6 g/dL (ref 30.0–36.0)
MCV: 107.6 fL — ABNORMAL HIGH (ref 80.0–100.0)
Platelets: 177 10*3/uL (ref 150–400)
RBC: 2.63 MIL/uL — ABNORMAL LOW (ref 4.22–5.81)
RDW: 18.7 % — ABNORMAL HIGH (ref 11.5–15.5)
WBC: 10.6 10*3/uL — ABNORMAL HIGH (ref 4.0–10.5)
nRBC: 0.9 % — ABNORMAL HIGH (ref 0.0–0.2)

## 2020-10-23 LAB — GLUCOSE, CAPILLARY
Glucose-Capillary: 169 mg/dL — ABNORMAL HIGH (ref 70–99)
Glucose-Capillary: 195 mg/dL — ABNORMAL HIGH (ref 70–99)
Glucose-Capillary: 246 mg/dL — ABNORMAL HIGH (ref 70–99)
Glucose-Capillary: 251 mg/dL — ABNORMAL HIGH (ref 70–99)
Glucose-Capillary: 271 mg/dL — ABNORMAL HIGH (ref 70–99)
Glucose-Capillary: 98 mg/dL (ref 70–99)

## 2020-10-23 LAB — C-REACTIVE PROTEIN: CRP: 1 mg/dL — ABNORMAL HIGH (ref <1.0)

## 2020-10-23 LAB — MAGNESIUM: Magnesium: 2.2 mg/dL (ref 1.7–2.4)

## 2020-10-23 MED ORDER — CYANOCOBALAMIN 1000 MCG/ML IJ SOLN
1000.0000 ug | Freq: Every day | INTRAMUSCULAR | Status: DC
  Administered 2020-10-24 – 2020-10-26 (×3): 1000 ug via SUBCUTANEOUS
  Filled 2020-10-23 (×4): qty 1

## 2020-10-23 MED ORDER — PYRIDOXINE HCL 100 MG/ML IJ SOLN
100.0000 mg | Freq: Every day | INTRAMUSCULAR | Status: DC
  Administered 2020-10-23 – 2020-10-26 (×4): 100 mg via INTRAVENOUS
  Filled 2020-10-23 (×5): qty 1

## 2020-10-23 NOTE — Progress Notes (Signed)
Physical Therapy Treatment Patient Details Name: Joseph Crawford MRN: [id number] DOB: 11/25/1950 Today's Date: 10/23/2020    History of Present Illness 70 y.o. 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 Pt found COVID + Pulse ox 86% on RA. Chest x-ray showed vague patchy airspace opacities in the left base. Admitted 09/23/2020 for treatment of COVID. Pt began with confusion 1/22 and head CT negative. Concern for aspiration PNA.    PT Comments    Daughter Joseph Crawford present for treatment and hopeful he would be more responsive with her.  Joseph Crawford reports pt was alert and following commands when she was here on Sunday. Today pt more lethargic and needed frequent verbal/tactile cues to arouse. Pt initially more alert when we sat him on the EOB but quickly faded. Pt not really following commands. Pt answered some questions from [place]. Continue to recommend SNF.   Follow Up Recommendations  SNF     Equipment Recommendations  Rolling walker with 5" wheels;Wheelchair (measurements PT);Wheelchair cushion (measurements PT);Hospital bed (hoyer lift)    Recommendations for Other Services       Precautions / Restrictions Precautions Precautions: Fall Precaution Comments: cortrak    Mobility  Bed Mobility Overal bed mobility: Needs Assistance Bed Mobility: Supine to Sit;Sit to Supine     Supine to sit: +2 for physical assistance;Max assist Sit to supine: +2 for physical assistance;Total assist   General bed mobility comments: Assist for all aspects  Transfers                 General transfer comment: Unable to stand with 2 person assist  Ambulation/Gait                 Stairs             Wheelchair  Mobility    Modified Rankin (Stroke Patients Only)       Balance Overall balance assessment: Needs assistance Sitting-balance support: Bilateral upper extremity supported;Feet supported Sitting balance-Leahy Scale: Poor Sitting balance - Comments: UE support and min to mod assist Postural control: Left lateral lean;Other (comment) (anterior)                                  Cognition Arousal/Alertness: Lethargic;Awake/alert (awake and alert at times but fades and sleepier.) Behavior During Therapy: Flat affect Overall Cognitive Status: Impaired/Different from baseline Area of Impairment: Orientation;Memory;Following commands;Safety/judgement;Awareness;Problem solving;Attention                 Orientation Level: Disoriented to;Place;Time;Situation Current Attention Level: Focused Memory: Decreased short-term memory Following Commands:  (Not following commands) Safety/Judgement: Decreased awareness of safety;Decreased awareness of deficits Awareness: Intellectual Problem Solving: Slow processing;Decreased initiation;Difficulty sequencing;Requires verbal cues;Requires tactile cues General Comments: Daughter Joseph Crawford present. Answered some questions but needed frequent verbal/tactile stimuli to stay alert. Was not following commands.      Exercises      General Comments        Pertinent Vitals/Pain Pain Assessment: Faces Faces Pain Scale: No hurt    Home Living                      Prior Function  PT Goals (current goals can now be found in the care plan section) Acute Rehab PT Goals Patient Stated Goal: none stated Progress towards PT goals: Not progressing toward goals - comment    Frequency    Min 2X/week      PT Plan Current plan remains appropriate;Frequency needs to be updated    Co-evaluation              AM-PAC PT "6 Clicks" Mobility   Outcome Measure  Help needed turning from your back to your side  while in a flat bed without using bedrails?: Total Help needed moving from lying on your back to sitting on the side of a flat bed without using bedrails?: Total Help needed moving to and from a bed to a chair (including a wheelchair)?: Total Help needed standing up from a chair using your arms (e.g., wheelchair or bedside chair)?: Total Help needed to walk in hospital room?: Total Help needed climbing 3-5 steps with a railing? : Total 6 Click Score: 6    End of Session Equipment Utilized During Treatment: Gait belt Activity Tolerance: Patient limited by lethargy;Patient limited by fatigue Patient left: in bed;with call bell/phone within reach;with bed alarm set Nurse Communication: Mobility status PT Visit Diagnosis: Unsteadiness on feet (R26.81);Other abnormalities of gait and mobility (R26.89);Muscle weakness (generalized) (M62.81)     Time: 3149-7026 PT Time Calculation (min) (ACUTE ONLY): 22 min  Charges:  $Therapeutic Activity: 8-22 mins                     [facility] PT Acute Rehabilitation Services Pager [phone] Office [phone]    Joseph Crawford [facility] 10/23/2020, 5:38 PM

## 2020-10-23 NOTE — Consult Note (Signed)
Neurology Consultation  Reason for Consult: encephalopathy Referring Physician: Leodis Binet, MD  CC: continued encephalopathy  History is obtained from: chart  HPI: Joseph Crawford is a 70 y.o. male with a PMHx of CAD s/p CABG x 4, HLD, tobacco abuse, HTN, and DM II. Joseph Crawford was admitted to hospital on 10/10/2020 for acute dyspnea, cough, and fever and found to be COVID positive. He was begun on COVID tx (steroids and Remdesivir) and has also been treated for bacterial/aspiration PNA as well. His hospital stay has been further complicated by superficial venous thrombosis right cephalic vein, thrombocytopenia thought to be secondary to bone marrow suppression from severe infection, sinus tachycardia/bradycardia with PVCs, dysphagia, and hypernatremia. He is now on tube feedings.   Encephalopathy work up has included a negative for acute CT head, ammonia of 44, normal TSH and free T4, negative for seizures EEG but positive for diffuse encephalopathy, normal UA and INR. CRP has been increased, but has trended downward. His platelet count is improving as well. B12 level was undetectable and B12 supplementation has been started by hospitalist.   In review of chart, patient was functioning independently at home and oriented. Per notes, around 10/13/20, he began to be confused. He was felt to be delirious on 10/14/20, more altered on 10/15/20, and confused since then. However, he has been able to say his name and the date at times. Per RN, patient stated his name earlier today and gripped on command.   ROS: Unable to obtain due to altered mental status.   Past Medical History:  Diagnosis Date  . Diabetes (HCC)   . Hyperlipidemia   . Hypertension   Tobacco abuse, CAD   History reviewed. No pertinent family history.  Social History:   reports that he has been smoking. He has a 80.00 pack-year smoking history. He has never used smokeless tobacco. No history on file for alcohol use and drug  use.  Medications  Current Facility-Administered Medications:  .  acetaminophen (TYLENOL) suppository 650 mg, 650 mg, Rectal, Q4H PRN, Osvaldo Shipper, MD, 650 mg at 10/16/20 1817 .  chlorhexidine (PERIDEX) 0.12 % solution 15 mL, 15 mL, Mouth Rinse, BID, Rolly Salter, MD, 15 mL at 10/23/20 1012 .  [START ON 10/24/2020] cyanocobalamin ((VITAMIN B-12)) injection 1,000 mcg, 1,000 mcg, Subcutaneous, Q1200, Rolly Salter, MD .  enoxaparin (LOVENOX) injection 40 mg, 40 mg, Subcutaneous, Daily, Rolly Salter, MD, 40 mg at 10/23/20 1012 .  feeding supplement (JEVITY 1.2 CAL) liquid 1,000 mL, 1,000 mL, Per Tube, Continuous, Rolly Salter, MD, Last Rate: 75 mL/hr at 10/23/20 0300, Infusion Verify at 10/23/20 0300 .  folic acid (FOLVITE) tablet 1 mg, 1 mg, Per Tube, Daily, Rolly Salter, MD, 1 mg at 10/23/20 1012 .  free water 200 mL, 200 mL, Per Tube, Q4H, Rolly Salter, MD, 200 mL at 10/23/20 1207 .  insulin aspart (novoLOG) injection 0-15 Units, 0-15 Units, Subcutaneous, Q4H, Osvaldo Shipper, MD, 8 Units at 10/23/20 1207 .  insulin detemir (LEVEMIR) injection 5 Units, 5 Units, Subcutaneous, BID, Rolly Salter, MD, 5 Units at 10/23/20 1013 .  MEDLINE mouth rinse, 15 mL, Mouth Rinse, q12n4p, Rolly Salter, MD, 15 mL at 10/23/20 1207 .  multivitamin liquid 15 mL, 15 mL, Per Tube, Daily, Rolly Salter, MD, 15 mL at 10/23/20 1012 .  [DISCONTINUED] ondansetron (ZOFRAN) tablet 4 mg, 4 mg, Oral, Q6H PRN **OR** ondansetron (ZOFRAN) injection 4 mg, 4 mg, Intravenous, Q6H PRN, Mansy, Jan A,  MD .  [address] predniSONE (DELTASONE) tablet 20 mg, 20 mg, Per Tube, Q breakfast, 20 mg at 10/21/20 0858 **FOLLOWED BY** predniSONE (DELTASONE) tablet 10 mg, 10 mg, Per Tube, Q breakfast, Rolly Salter, MD, 10 mg at 10/23/20 1012 .  pyridOXINE (B-6) injection 100 mg, 100 mg, Intravenous, Daily, Rolly Salter, MD .  saccharomyces boulardii (FLORASTOR) capsule 250 mg, 250 mg, Per Tube, BID, Rolly Salter,  MD, 250 mg at 10/23/20 1012 .  thiamine tablet 100 mg, 100 mg, Per Tube, Daily, Rolly Salter, MD, 100 mg at 10/23/20 1012  Exam: Current vital signs: BP 106/67 (BP Location: Right Arm)   Pulse 81   Temp 98.4 F (36.9 C) (Axillary)   Resp (!) 24   Ht 6' (1.829 m)   Wt 81.1 kg   SpO2 96%   BMI 24.25 kg/m  Vital signs in last 24 hours: Temp:  [97.6 F (36.4 C)-98.6 F (37 C)] 98.4 F (36.9 C) (02/01 1300) Pulse Rate:  [67-94] 81 (02/01 1300) Resp:  [18-24] 24 (02/01 1300) BP: (106-131)/(61-71) 106/67 (02/01 1300) SpO2:  [90 %-96 %] 96 % (02/01 1300)  GENERAL: Awake, alert in NAD. Lying in bed.  HEENT: - Normocephalic and atraumatic LUNGS - Normal respiratory effort. CV - RRR on tele ABDOMEN - Soft, nontender Ext: warm, well perfused  NEURO:  Mental Status: Awake, alert. Smiles when examiner leans forward. Attempts to say his name, but only whispers non sensical words. No answer to orientation questions. Other than that, he just moans.  Speech/Language: speech is not fluent. Comprehension very slowed.  Cranial Nerves:  II: PERRL 18mm/brisk.  III, IV, VI: He will follow examiner's finger to the right, but not to the left. Uncertain if he is unable to do this, or just does not understand the command. Lid elevation symmetric and full.  V: Blinks to threat. Able to open mouth.  VII: Smile is symmetrical. Furls brow with pressure.  VIII:hearing intact to voice IX, X: Will not open mouth fully.  XI: head is grossly midline.  XII: tongue is symmetrical without fasciculations.   Motor: He does resist passive movements of extremities.  Tone is normal. Bulk is normal.  Sensation- withdraws to noxious stimuli in extremities.   Coordination: No tremors, jerking, or clonus. Plantars are down going.  DTRs: unable to illicit due to resistance.  Gait- deferred  Labs I have reviewed labs in epic and the results pertinent to this consultation are: Ammonia 44, HbA1c 6.4%, TSH normal,  free T4 normal, INR normal. CRP elevated but down to 1 today, UA negative.   CBC    Component Value Date/Time   WBC 10.6 (H) 10/23/2020 0259   RBC 2.63 (L) 10/23/2020 0259   HGB 9.5 (L) 10/23/2020 0259   HCT 28.3 (L) 10/23/2020 0259   PLT 177 10/23/2020 0259   MCV 107.6 (H) 10/23/2020 0259   MCH 36.1 (H) 10/23/2020 0259   MCHC 33.6 10/23/2020 0259   RDW 18.7 (H) 10/23/2020 0259   LYMPHSABS 1.2 10/16/2020 0307   MONOABS 0.3 10/16/2020 0307   EOSABS 0.0 10/16/2020 0307   BASOSABS 0.0 10/16/2020 0307    CMP     Component Value Date/Time   NA 143 10/23/2020 0259   K 3.8 10/23/2020 0259   CL 104 10/23/2020 0259   CO2 32 10/23/2020 0259   GLUCOSE 223 (H) 10/23/2020 0259   BUN 17 10/23/2020 0259   CREATININE 0.81 10/23/2020 0259   CALCIUM 9.8 10/23/2020 0259  PROT 5.8 (L) 10/16/2020 0307   ALBUMIN 2.1 (L) 10/16/2020 0307   AST 23 10/16/2020 0307   ALT 27 10/16/2020 0307   ALKPHOS 27 (L) 10/16/2020 0307   BILITOT 2.7 (H) 10/16/2020 0307   GFRNONAA >60 10/23/2020 0259    Lipid Panel     Component Value Date/Time   TRIG 269 (H) 10/10/2020 1532   Imaging CT-head: No acute finding by CT. Age related atrophy and mild chronic small-vessel change of the white matter.  EEG: This study is suggestive of moderate diffuse encephalopathy, nonspecific etiology. No seizures or epileptiform discharges were seen throughout the recording.  Assessment: 70 yo male with a long, complicated hospital stay who was found to have encephalopathy on or about 10/13/20, which has waxed and waned. Also, thought to have delirium and Seroquel was tried, but discontinued. Patient has multiple reasons to have encephalopathy, including COVID, bacterial PNA, length of stay, metabolic derangement, B12 deficiency, and hyperglycemia. His encephalopathy could be due to steroids as well, and may improve as those are tapered. Due to his extensive vascular hx and risk factors, will check CTA head and neck to r/o  thrombus given COVID's hypercoagulopathy tendencies and the fact that he already has a SVT. This may not be able to be performed, however, given his tenuous respiratory status. MRI brain can also be considered if he is stable.   Impression: 1. Toxic metabolic encephalopathy, multifactorial  Recommendations: -CTA head and neck if stable for procedure. -MRI brain if stable.  -Continue to treat metabolic derangements, hyperglycemia, and infection as you are doing. -Agree with B12 supplementation. -Avoid sedative medications, especially those with confusion side effects in the elderly.   Pt seen by Jimmye Norman, NP/Neuro. Chart review conducted with Dr. Thomasena Edis. Plan discussed as well and was formulated by attending.   Pager: [phone]

## 2020-10-23 NOTE — Progress Notes (Signed)
[facility] Progress Note  Patient: Joseph Crawford    MBT:[id number]  DOA: 11-07-2020     Date of Service: the patient was seen and examined on 10/23/2020  Brief hospital course: 70 y.o.Caucasian malewith a known history of type 2 diabetes mellitus, coronary artery disease status post four-vessel CABG, dyslipidemia, hypertension and ongoing tobacco abuse, who presented to the emergency room with acute onset of worsening dyspnea with associated dry cough as well as fever and chills for the last week. The patient has not been vaccinated against COVID-19.  He tested positive for COVID-19.  Chest x-ray showed patchy airspace disease.  Patient was hospitalized for further management.  Currently plan is continue to engage with the family regarding goals of care, monitor for improvement in mentation.  If the MRI is significant or if there is no improvement in patient's mentation in next few days daughter currently leaning towards residential hospice.  Assessment and Plan: 1. Acute Hypoxic respiratory failure, POA, 86 % on room air on admission.  Acute COVID-19 Viral Pneumonia CXR: hazy bilateral peripheral opacities Oxygen requirement: On room air CRP: Trending down, 1.2 Remdesivir: Completed remdesivir on 1/24 Steroids: Currently on prednisone taper. Baricitinib/Actemra: Not indicated given concern with bacterial infection. Antibiotics: Started on Unasyn, coverage broadened to [place].  Currently completed both antibiotics. MRSA PCR negative.  Vancomycin canceled.  DVT Prophylaxis: enoxaparin (LOVENOX) injection 40 mg Start: 10/19/20 1000 Place and maintain sequential compression device Start: 10/15/20 1209  Overall plan: Prognosis very poor. From Covid point of view patient appears stable  Isolation duration: First positive test date 01/20, out of isolation 11/02/2020  The treatment plan and use of medications and known side effects were discussed with patient/family. It was clearly  explained that complete risks and long-term side effects are unknown. Patient/family agree with the treatment plan.    2.  Acute metabolic encephalopathy Severe B12 deficiency Hospital induced delirium. B12 undetectable. Mentation progressively worsened during the hospital stay. Per family at baseline patient is alert awake and oriented x4, takes care of his medicines, groceries, drives a car. Suspected secondary to infection, COVID-19 as well as electrolyte abnormality. Patient was initiated on IV antibiotics. ABG did not show hypercapnia. CT of the head unremarkable for any acute stress as well. B1 level currently pending. Continue supplementation Ammonia, folic acid, TSH and free T4 level unremarkable. Seroquel was briefly added but currently discontinued. EEG negative for any acute abnormality. Per family patient was able to converse with them, identify the daughters and follow range of motion activities with them. Neurology consult appreciated Recommend MRI brain. We will check it.  3.  Dysphagia Unable to swallow safely secondary to encephalopathy. Currently NPO. Discussed with family currently proceeding with cortrack. Intermittently receiving IV fluid. Tolerating tube feeding. Mentation not improving. Discussed with family regarding goals of care. Speech therapy unable to work with the patient and therefore currently signed off.  Will reconsult when the patient is improving mentation wise.  4.  Type 2 diabetes mellitus with hyperglycemia without any complication Hemoglobin A1c 6.4. Holding home regimen. Currently on sliding scale insulin.  Added Levemir  5.  Essential hypertension Blood pressure stable. As needed IV Lopressor  6.  Severe thrombocytopenia Likely consumption secondary to SVT We will repeat a Doppler of the upper extremity to ensure no progression. in the setting of acute illness. H&H relatively stable but No schistocytes on the smear. Fibrinogen  level 424, INR normal thus less likely hemolytic process. Currently improving.  7.  Sinus tachycardia. Currently  stable. Monitor.  8.  GERD. Monitor  9.  Hypernatremia. Currently receiving free water intermittent was on D5. Now normal.  Continue free water.  IV fluids stopped.  10. Goals of care conversation. Extensive discussion with both daughters on the phone on 1/26. Also on 1/31 and 1/30. Patient's prognosis is very poor given his ongoing encephalopathy as well as difficulty swallowing. Appears to be suffering from COVID-19 pneumonia as well as bacterial pneumonia. Severe thrombocytopenia likely in response to marrow suppression from severe infection. Outcome of the situation the patient is in in the setting of a cardiac arrest is very poor.  Neurological recovery is less likely.  Based on this, after my recommendation family decided that patient should not be resuscitated or intubated in the setting of acute cardiac arrest/respiratory arrest. Daughter currently leaning towards residential hospice should the patient's condition does not improve down the road.  We will monitor for improvement.  11.  IV infiltration. Right upper extremity SVT Right upper extremity swollen after initiation of IV D5 again. Doppler is positive for SVT.  No evidence of DVT. Recommended RN to elevate upper extremity.  12.  Sinus bradycardia. PVCs, frequent NSVT Noted on telemetry on 1/30.  No further bradycardia. Ongoing PVCs. Echocardiogram showed preserved EF without any acute abnormality. Currently holding off on introducing scheduled Lopressor given his bradycardic events.  Once stable introduce Lopressor protein. Monitor.  Diet: N.p.o. DVT Prophylaxis:   enoxaparin (LOVENOX) injection 40 mg Start: 10/19/20 1000 Place and maintain sequential compression device Start: 10/15/20 1209   Advance goals of care discussion: Limited code  Family Communication: no family was present at bedside,  at the time of interview.  Discussed with daughter, allowed them to visit given patient's poor prognosis.  Disposition:  Status is: Inpatient  Remains inpatient appropriate because:IV treatments appropriate due to intensity of illness or inability to take PO and Inpatient level of care appropriate due to severity of illness  Dispo:  Patient From: Home  Planned Disposition: Skilled Nursing Facility  Expected discharge date: 10/25/2020  Medically stable for discharge: No     Subjective: No nausea no vomiting.  No fever no chills.  Unable to follow any commands.  Physical Exam:  General: Appear in mild distress, no Rash; Oral Mucosa Clear, moist. no Abnormal Neck Mass Or lumps, Conjunctiva normal  Cardiovascular: S1 and S2 Present, no Murmur, Respiratory: good respiratory effort, Bilateral Air entry present and CTA, no Crackles, no wheezes Abdomen: Bowel Sound present, Soft and no tenderness Extremities: no Pedal edema Neurology: Lethargic, not oriented, unable to follow any commands.  Flat affect. No focal deficit. Gait not checked due to patient safety concerns  Vitals:   10/23/20 1100 10/23/20 1300 10/23/20 1441 10/23/20 1500  BP:  106/67 120/66 (!) 106/55  Pulse: 91 81 79 66  Resp: 19 (!) 24 20 19   Temp:  98.4 F (36.9 C)  97.9 F (36.6 C)  TempSrc:  Axillary  Axillary  SpO2: 95% 96% 95% 93%  Weight:      Height:        Intake/Output Summary (Last 24 hours) at 10/23/2020 2015 Last data filed at 10/23/2020 1300 Gross per 24 hour  Intake -  Output 900 ml  Net -900 ml   Filed Weights   10/17/20 0411 10/21/20 0437 10/22/20 0500  Weight: 74 kg 80.4 kg 81.1 kg    Data Reviewed: I have personally reviewed and interpreted daily labs, tele strips, imaging. I reviewed all nursing notes, pharmacy notes, vitals, pertinent  old records I have discussed plan of care as described above with RN and patient/family.  CBC: Recent Labs  Lab 10/18/20 0542 10/19/20 0551  10/20/20 0302 10/21/20 0456 10/23/20 0259  WBC 11.1* 12.3* 11.5* 10.6* 10.6*  HGB 12.0* 10.8* 10.2* 10.2* 9.5*  HCT 33.2* 31.4* 28.5* 30.1* 28.3*  MCV 107.1* 108.7* 108.8* 108.3* 107.6*  PLT 88* 101* 100* 125* 177   Basic Metabolic Panel: Recent Labs  Lab 10/20/20 0302 10/20/20 0845 10/20/20 1704 10/21/20 0456 10/21/20 1701 10/22/20 0613 10/23/20 0259  NA 151*   < > 147* 144 142 143 143  K 3.6   < > 4.8 3.9 4.4 3.7 3.8  CL 113*   < > 109 105 105 105 104  CO2 29   < > 30 33* 32 31 32  GLUCOSE 224*   < > 287* 284* 275* 243* 223*  BUN 19   < > 18 16 16 17 17   CREATININE 0.85   < > 0.83 0.77 0.73 0.75 0.81  CALCIUM 9.4   < > 9.3 9.4 9.4 9.7 9.8  MG 2.0  --   --   --   --   --  2.2   < > = values in this interval not displayed.    Studies: No results found.  Scheduled Meds: . chlorhexidine  15 mL Mouth Rinse BID  . [START ON 10/24/2020] cyanocobalamin  1,000 mcg Subcutaneous Q1200  . enoxaparin (LOVENOX) injection  40 mg Subcutaneous Daily  . folic acid  1 mg Per Tube Daily  . free water  200 mL Per Tube Q4H  . insulin aspart  0-15 Units Subcutaneous Q4H  . insulin detemir  5 Units Subcutaneous BID  . mouth rinse  15 mL Mouth Rinse q12n4p  . multivitamin  15 mL Per Tube Daily  . predniSONE  10 mg Per Tube Q breakfast  . pyridOXINE  100 mg Intravenous Daily  . saccharomyces boulardii  250 mg Per Tube BID  . thiamine  100 mg Per Tube Daily   Continuous Infusions: . feeding supplement (JEVITY 1.2 CAL) 1,000 mL (10/23/20 1751)   PRN Meds: acetaminophen, [DISCONTINUED] ondansetron **OR** ondansetron (ZOFRAN) IV  Time spent: 35 minutes  Author: 12/21/20, MD [facility] 10/23/2020 8:15 PM  To reach On-call, see care teams to locate the attending and reach out via [email or web address]. Between 7PM-7AM, please contact night-coverage If you still have difficulty reaching the attending provider, please page the [facility] (Director on Call) for [facility] on amion for  assistance.

## 2020-10-23 NOTE — Progress Notes (Addendum)
Inpatient Diabetes Program Recommendations  AACE/ADA: New Consensus Statement on Inpatient Glycemic Control (2015)  Target Ranges:  Prepandial:   less than 140 mg/dL      Peak postprandial:   less than 180 mg/dL (1-2 hours)      Critically ill patients:  140 - 180 mg/dL   Results for JERARD, BAYS (MRN [id number]) as of 10/23/2020 09:38  Ref. Range 10/22/2020 00:39 10/22/2020 04:13 10/22/2020 08:02 10/22/2020 12:35 10/22/2020 16:52 10/22/2020 20:52  Glucose-Capillary Latest Ref Range: 70 - 99 mg/dL 469 (H) 629 (H) 528 (H) 163 (H) 262 (H) 248 (H)   Results for KYSTON, GONCE (MRN [id number]) as of 10/23/2020 09:38  Ref. Range 10/23/2020 00:38 10/23/2020 04:51  Glucose-Capillary Latest Ref Range: 70 - 99 mg/dL 272 (H) 536 (H)   Home DM Meds: Metformin 850 mg BID  Current Orders: Levemir 5 units BID                            Novolog Moderate Correction Scale/ SSI (0-15 units) Q4 hours     Prednisone 10 mg Daily  Jevity tube feedings 75cc/hr   MD- Note CBGs consistently >200.  Please consider adding Novolog Tube Feed Coverage:  Novolog 4 units Q4 hours (give in addition to the ordered SSI)  Please HOLD if tube feeds HELD for any reason     --Will follow patient during hospitalization--  Ambrose Finland RN, MSN, CDE Diabetes Coordinator Inpatient Glycemic Control Team Team Pager: [phone] (8a-5p)

## 2020-10-23 NOTE — Progress Notes (Signed)
Pt's HR dropping into 40's nonsustained. BP 120/66, restinging bed with no changes or distress noted. Allena Katz, MD notified. EKG performed.

## 2020-10-23 DEATH — deceased

## 2020-10-24 ENCOUNTER — Inpatient Hospital Stay (HOSPITAL_COMMUNITY): Payer: Medicare Other

## 2020-10-24 DIAGNOSIS — R1310 Dysphagia, unspecified: Secondary | ICD-10-CM | POA: Diagnosis not present

## 2020-10-24 DIAGNOSIS — U071 COVID-19: Secondary | ICD-10-CM | POA: Diagnosis not present

## 2020-10-24 DIAGNOSIS — Z7189 Other specified counseling: Secondary | ICD-10-CM

## 2020-10-24 DIAGNOSIS — G9341 Metabolic encephalopathy: Secondary | ICD-10-CM | POA: Diagnosis not present

## 2020-10-24 DIAGNOSIS — E1165 Type 2 diabetes mellitus with hyperglycemia: Secondary | ICD-10-CM | POA: Diagnosis not present

## 2020-10-24 DIAGNOSIS — E860 Dehydration: Secondary | ICD-10-CM

## 2020-10-24 DIAGNOSIS — I10 Essential (primary) hypertension: Secondary | ICD-10-CM

## 2020-10-24 DIAGNOSIS — E538 Deficiency of other specified B group vitamins: Secondary | ICD-10-CM

## 2020-10-24 DIAGNOSIS — Z515 Encounter for palliative care: Secondary | ICD-10-CM

## 2020-10-24 LAB — GLUCOSE, CAPILLARY
Glucose-Capillary: 121 mg/dL — ABNORMAL HIGH (ref 70–99)
Glucose-Capillary: 176 mg/dL — ABNORMAL HIGH (ref 70–99)
Glucose-Capillary: 198 mg/dL — ABNORMAL HIGH (ref 70–99)
Glucose-Capillary: 212 mg/dL — ABNORMAL HIGH (ref 70–99)
Glucose-Capillary: 214 mg/dL — ABNORMAL HIGH (ref 70–99)
Glucose-Capillary: 214 mg/dL — ABNORMAL HIGH (ref 70–99)
Glucose-Capillary: 94 mg/dL (ref 70–99)

## 2020-10-24 LAB — BASIC METABOLIC PANEL
Anion gap: 9 (ref 5–15)
BUN: 18 mg/dL (ref 8–23)
CO2: 30 mmol/L (ref 22–32)
Calcium: 9.5 mg/dL (ref 8.9–10.3)
Chloride: 102 mmol/L (ref 98–111)
Creatinine, Ser: 0.7 mg/dL (ref 0.61–1.24)
GFR, Estimated: >60 mL/min (ref >60)
Glucose, Bld: 134 mg/dL — ABNORMAL HIGH (ref 70–99)
Potassium: 3.8 mmol/L (ref 3.5–5.1)
Sodium: 141 mmol/L (ref 135–145)

## 2020-10-24 LAB — CBC
HCT: 27.7 % — ABNORMAL LOW (ref 39.0–52.0)
Hemoglobin: 9.1 g/dL — ABNORMAL LOW (ref 13.0–17.0)
MCH: 36.1 pg — ABNORMAL HIGH (ref 26.0–34.0)
MCHC: 32.9 g/dL (ref 30.0–36.0)
MCV: 109.9 fL — ABNORMAL HIGH (ref 80.0–100.0)
Platelets: 186 10*3/uL (ref 150–400)
RBC: 2.52 MIL/uL — ABNORMAL LOW (ref 4.22–5.81)
RDW: 18.9 % — ABNORMAL HIGH (ref 11.5–15.5)
WBC: 10.5 10*3/uL (ref 4.0–10.5)
nRBC: 0.6 % — ABNORMAL HIGH (ref 0.0–0.2)

## 2020-10-24 LAB — MAGNESIUM: Magnesium: 2.1 mg/dL (ref 1.7–2.4)

## 2020-10-24 MED ORDER — POTASSIUM CHLORIDE 20 MEQ PO PACK
20.0000 meq | PACK | Freq: Once | ORAL | Status: AC
  Administered 2020-10-24: 20 meq
  Filled 2020-10-24: qty 1

## 2020-10-24 NOTE — Plan of Care (Signed)
Problem: Education: Goal: Knowledge of General Education information will improve Description: Including pain rating scale, medication(s)/side effects and non-pharmacologic comfort measures 10/24/2020 2205 by Royetta Crochet, RN Outcome: Progressing 10/24/2020 2205 by Royetta Crochet, RN Outcome: Progressing   Problem: Health Behavior/Discharge Planning: Goal: Ability to manage health-related needs will improve 10/24/2020 2205 by Royetta Crochet, RN Outcome: Progressing 10/24/2020 2205 by Royetta Crochet, RN Outcome: Progressing   Problem: Clinical Measurements: Goal: Ability to maintain clinical measurements within normal limits will improve 10/24/2020 2205 by Royetta Crochet, RN Outcome: Progressing 10/24/2020 2205 by Royetta Crochet, RN Outcome: Progressing Goal: Will remain free from infection 10/24/2020 2205 by Royetta Crochet, RN Outcome: Progressing 10/24/2020 2205 by Royetta Crochet, RN Outcome: Progressing Goal: Diagnostic test results will improve 10/24/2020 2205 by Royetta Crochet, RN Outcome: Progressing 10/24/2020 2205 by Royetta Crochet, RN Outcome: Progressing Goal: Respiratory complications will improve 10/24/2020 2205 by Royetta Crochet, RN Outcome: Progressing 10/24/2020 2205 by Royetta Crochet, RN Outcome: Progressing Goal: Cardiovascular complication will be avoided 10/24/2020 2205 by Royetta Crochet, RN Outcome: Progressing 10/24/2020 2205 by Royetta Crochet, RN Outcome: Progressing   Problem: Activity: Goal: Risk for activity intolerance will decrease 10/24/2020 2205 by Royetta Crochet, RN Outcome: Progressing 10/24/2020 2205 by Royetta Crochet, RN Outcome: Progressing   Problem: Nutrition: Goal: Adequate nutrition will be maintained 10/24/2020 2205 by Royetta Crochet, RN Outcome: Progressing 10/24/2020 2205 by Royetta Crochet, RN Outcome: Progressing   Problem: Coping: Goal: Level of anxiety will decrease 10/24/2020 2205 by Royetta Crochet, RN Outcome:  Progressing 10/24/2020 2205 by Royetta Crochet, RN Outcome: Progressing   Problem: Elimination: Goal: Will not experience complications related to bowel motility 10/24/2020 2205 by Royetta Crochet, RN Outcome: Progressing 10/24/2020 2205 by Royetta Crochet, RN Outcome: Progressing Goal: Will not experience complications related to urinary retention 10/24/2020 2205 by Royetta Crochet, RN Outcome: Progressing 10/24/2020 2205 by Royetta Crochet, RN Outcome: Progressing   Problem: Pain Managment: Goal: General experience of comfort will improve 10/24/2020 2205 by Royetta Crochet, RN Outcome: Progressing 10/24/2020 2205 by Royetta Crochet, RN Outcome: Progressing   Problem: Safety: Goal: Ability to remain free from injury will improve 10/24/2020 2205 by Royetta Crochet, RN Outcome: Progressing 10/24/2020 2205 by Royetta Crochet, RN Outcome: Progressing   Problem: Skin Integrity: Goal: Risk for impaired skin integrity will decrease 10/24/2020 2205 by Royetta Crochet, RN Outcome: Progressing 10/24/2020 2205 by Royetta Crochet, RN Outcome: Progressing   Problem: Education: Goal: Knowledge of risk factors and measures for prevention of condition will improve 10/24/2020 2205 by Royetta Crochet, RN Outcome: Progressing 10/24/2020 2205 by Royetta Crochet, RN Outcome: Progressing   Problem: Coping: Goal: Psychosocial and spiritual needs will be supported 10/24/2020 2205 by Royetta Crochet, RN Outcome: Progressing 10/24/2020 2205 by Royetta Crochet, RN Outcome: Progressing   Problem: Respiratory: Goal: Will maintain a patent airway 10/24/2020 2205 by Royetta Crochet, RN Outcome: Progressing 10/24/2020 2205 by Royetta Crochet, RN Outcome: Progressing Goal: Complications related to the disease process, condition or treatment will be avoided or minimized 10/24/2020 2205 by Royetta Crochet, RN Outcome: Progressing 10/24/2020 2205 by Royetta Crochet, RN Outcome: Progressing   Problem:  Safety: Goal: Non-violent Restraint(s) 10/24/2020 2205 by Royetta Crochet, RN Outcome: Progressing 10/24/2020 2205 by Royetta Crochet, RN Outcome: Progressing   Problem: Safety: Goal: Non-violent Restraint(s) 10/24/2020 2205 by Donata Clay  M, RN Outcome: Progressing 10/24/2020 2205 by Royetta Crochet, RN Outcome: Progressing

## 2020-10-24 NOTE — Plan of Care (Signed)
  Problem: Education: Goal: Knowledge of General Education information will improve Description: Including pain rating scale, medication(s)/side effects and non-pharmacologic comfort measures Outcome: Progressing   Problem: Health Behavior/Discharge Planning: Goal: Ability to manage health-related needs will improve Outcome: Progressing   Problem: Clinical Measurements: Goal: Ability to maintain clinical measurements within normal limits will improve Outcome: Progressing Goal: Will remain free from infection Outcome: Progressing Goal: Diagnostic test results will improve Outcome: Progressing Goal: Respiratory complications will improve Outcome: Progressing Goal: Cardiovascular complication will be avoided Outcome: Progressing   Problem: Activity: Goal: Risk for activity intolerance will decrease Outcome: Progressing   Problem: Nutrition: Goal: Adequate nutrition will be maintained Outcome: Progressing   Problem: Coping: Goal: Level of anxiety will decrease Outcome: Progressing   Problem: Elimination: Goal: Will not experience complications related to bowel motility Outcome: Progressing Goal: Will not experience complications related to urinary retention Outcome: Progressing   Problem: Pain Managment: Goal: General experience of comfort will improve Outcome: Progressing   Problem: Safety: Goal: Ability to remain free from injury will improve Outcome: Progressing   Problem: Skin Integrity: Goal: Risk for impaired skin integrity will decrease Outcome: Progressing   Problem: Education: Goal: Knowledge of risk factors and measures for prevention of condition will improve Outcome: Progressing   Problem: Coping: Goal: Psychosocial and spiritual needs will be supported Outcome: Progressing   Problem: Respiratory: Goal: Will maintain a patent airway Outcome: Progressing Goal: Complications related to the disease process, condition or treatment will be avoided or  minimized Outcome: Progressing   Problem: Safety: Goal: Non-violent Restraint(s) Outcome: Progressing   Problem: Safety: Goal: Non-violent Restraint(s) Outcome: Progressing

## 2020-10-24 NOTE — Progress Notes (Signed)
PROGRESS NOTE    Joseph Crawford  FUX:[id number] DOB: 12-Oct-1950 DOA: 10/19/2020 PCP: Hurshel Party, NP   Brief Narrative: Dvontae Crawford is a 70 y.o. male with a history of diabetes mellitus, CAD s/p CABG x4, hyperlipidemia, hypertension, tobacco use. Patient presented secondary to worsening dyspnea in setting of COVID-19 pneumonia. Hospitalization complicated by significant encephalopathy possibly secondary to severe vitamin B12 deficiency.   Assessment & Plan:   Principal Problem:   Acute hypoxemic respiratory failure due to COVID-19 [facility]) Active Problems:   Acute metabolic encephalopathy   Vitamin B12 deficiency   Dysphagia   Primary hypertension   Uncontrolled type 2 diabetes mellitus with hyperglycemia (HCC)   Acute respiratory failure with hypoxia Secondary to COVID-19 pneumonia. Still requiring 2-3 L of oxygen -Wean to room air as able -Keep O2 saturation >92%  Acute metabolic encephalopathy Possibly secondary to severe vitamin B12 deficiency. EEG without evidence of seizure. MRI without evidence of acute pathology/stroke. ABG without hypercapnia. TSH and folic acid normal. Ammonia slightly elevated at 44. NG tube in place for feeding. -Continue Vitamin B12 treatment -Continue B6, thiamine, folic acid -Neurology recommendations: pending today -B1 pending  Vitamin B12 deficiency Severe.  -Check intrinsic factor ab -Continue Vitamin B12 1000 mg subcu daily  COVID-19 pneumonia Chest x-ray with bilateral opacities. Patient treated with Remdesivir, steroids and oxygen. Baricitinib not given secondary to concern for bacterial infection. CRP trended down.  Dysphagia Secondary to encephalopathy. NG tube placed (small bore) -Continue tube feeds  Diabetes mellitus, type 2 -Continue Levemir and SSI  Primary hypertension Well controlled.  Thrombocytopenia Mild. No active bleeding. In setting of acute infection. Resolved.  GERD No symptoms. Not on  pharmacotherapy.  Hypernatremia Mild. Improved with free water.  Superficial venous thrombosis Right upper extremity. Supportive care.  Sinus bradycardia PVCs Frequent NSVT -Continue telemetry   DVT prophylaxis: Lovenox Code Status:   Code Status: Partial Code Family Communication: Called daughter, Donnie Panik Disposition Plan: Discharge pending improvement of encephalopathy vs possible discharge to SNF vs LTACH if plan to allow treatment of B12 deficiency   Consultants:   Neurology  Palliative care medicine  Procedures:   TRANSTHORACIC ECHOCARDIOGRAM (10/20/2020) IMPRESSIONS    1. Left ventricular ejection fraction, by estimation, is 65 to 70%. The  left ventricle has normal function. The left ventricle has no regional  wall motion abnormalities. Left ventricular diastolic parameters are  consistent with Grade I diastolic  dysfunction (impaired relaxation).  2. Left atrial size was mild to moderately dilated.  3. The mitral valve is normal in structure. No evidence of mitral valve  regurgitation.  4. The aortic valve is grossly normal. Aortic valve regurgitation is not  visualized. Mild aortic valve sclerosis is present, with no evidence of  aortic valve stenosis.  5. Tricuspid regurgitation signal is inadequate for assessing PA  pressure.   Antimicrobials:  Vancomycin  Remdesivir  Ceftazidime  Unasyn    Subjective: Cannot provide history secondary to mental status.  Objective: Vitals:   10/24/20 0700 10/24/20 0834 10/24/20 1120 10/24/20 1534  BP:  123/62 132/70 (!) 116/57  Pulse: 95 (!) 56 75 76  Resp: (!) 21 20 20 18   Temp: 98.6 F (37 C) 98.6 F (37 C) 98.3 F (36.8 C) 98.7 F (37.1 C)  TempSrc: Axillary Oral Axillary Axillary  SpO2: 94% 95% 97% 98%  Weight:      Height:        Intake/Output Summary (Last 24 hours) at 10/24/2020 1730 Last data filed at  10/24/2020 1538 Gross per 24 hour  Intake -  Output 1820 ml  Net -1820 ml    Filed Weights   10/17/20 0411 10/21/20 0437 10/22/20 0500  Weight: 74 kg 80.4 kg 81.1 kg    Examination:  General exam: Appears calm and comfortable Respiratory system: Clear to auscultation. Respiratory effort normal. Cardiovascular system: S1 & S2 heard, RRR. No murmurs, rubs, gallops or clicks. Gastrointestinal system: Abdomen is nondistended, soft and nontender. No organomegaly or masses felt. Normal bowel sounds heard. Central nervous system: Alert and not oriented. Follows some simple commands like blinking his eyes and squeezing the nurses hand.  Musculoskeletal: No calf tenderness Skin: No cyanosis. No rashes Psychiatry: Judgement and insight appear impaired    Data Reviewed: I have personally reviewed following labs and imaging studies  CBC Lab Results  Component Value Date   WBC 10.5 10/24/2020   RBC 2.52 (L) 10/24/2020   HGB 9.1 (L) 10/24/2020   HCT 27.7 (L) 10/24/2020   MCV 109.9 (H) 10/24/2020   MCH 36.1 (H) 10/24/2020   PLT 186 10/24/2020   MCHC 32.9 10/24/2020   RDW 18.9 (H) 10/24/2020   LYMPHSABS 1.2 10/16/2020   MONOABS 0.3 10/16/2020   EOSABS 0.0 10/16/2020   BASOSABS 0.0 10/16/2020     Last metabolic panel Lab Results  Component Value Date   NA 141 10/24/2020   K 3.8 10/24/2020   CL 102 10/24/2020   CO2 30 10/24/2020   BUN 18 10/24/2020   CREATININE 0.70 10/24/2020   GLUCOSE 134 (H) 10/24/2020   GFRNONAA >60 10/24/2020   CALCIUM 9.5 10/24/2020   PROT 5.8 (L) 10/16/2020   ALBUMIN 2.1 (L) 10/16/2020   BILITOT 2.7 (H) 10/16/2020   ALKPHOS 27 (L) 10/16/2020   AST 23 10/16/2020   ALT 27 10/16/2020   ANIONGAP 9 10/24/2020    CBG (last 3)  Recent Labs    10/24/20 0827 10/24/20 1120 10/24/20 1535  GLUCAP 214* 121* 214*     GFR: Estimated Creatinine Clearance: 94.3 mL/min (by C-G formula based on SCr of 0.7 mg/dL).  Coagulation Profile: No results for input(s): INR, PROTIME in the last 168 hours.  Recent Results (from the past  240 hour(s))  Culture, blood (Routine X 2) w Reflex to ID Panel     Status: None   Collection Time: 10/15/20 11:34 AM   Specimen: BLOOD RIGHT HAND  Result Value Ref Range Status   Specimen Description BLOOD RIGHT HAND  Final   Special Requests   Final    BOTTLES DRAWN AEROBIC ONLY Blood Culture adequate volume   Culture   Final    NO GROWTH 5 DAYS Performed at [facility], [address], [place], [place] [address]    Report Status 10/20/2020 FINAL  Final  Culture, blood (Routine X 2) w Reflex to ID Panel     Status: None   Collection Time: 10/15/20 11:34 AM   Specimen: BLOOD RIGHT HAND  Result Value Ref Range Status   Specimen Description BLOOD RIGHT HAND  Final   Special Requests   Final    BOTTLES DRAWN AEROBIC ONLY Blood Culture adequate volume   Culture   Final    NO GROWTH 5 DAYS Performed at [facility], [address] [place], [place] [address]    Report Status 10/20/2020 FINAL  Final  MRSA PCR Screening     Status: None   Collection Time: 10/17/20  5:44 PM   Specimen: Nasopharyngeal Wash  Result  Value Ref Range Status   MRSA by PCR NEGATIVE NEGATIVE Final    Comment:        The GeneXpert MRSA Assay (FDA approved for NASAL specimens only), is one component of a comprehensive MRSA colonization surveillance program. It is not intended to diagnose MRSA infection nor to guide or monitor treatment for MRSA infections. Performed at [facility], [address], [place], [place] [address]         Radiology Studies: MR BRAIN WO CONTRAST  Result Date: 10/24/2020 CLINICAL DATA:  Mental status change, unknown cause. EXAM: MRI HEAD WITHOUT CONTRAST TECHNIQUE: Multiplanar, multiecho pulse sequences of the brain and surrounding structures were obtained without intravenous contrast. COMPARISON:  CT head 10/16/2020.  MRI December 20, 2003. FINDINGS: Motion limited study. Brain: No acute infarction, hemorrhage, hydrocephalus, extra-axial collection or mass  lesion. Moderate diffuse cerebral atrophy, progressed since 2005. Mild for age T2/FLAIR hyperintensities in the white matter, likely related to chronic microvascular ischemic disease. Vascular: Major arterial flow voids are maintained at the skull base. Skull and upper cervical spine: Normal marrow signal. Sinuses/Orbits: Scattered paranasal sinus mucosal thickening with air-fluid levels in the left maxillary sinus and right sphenoid sinus. Other: Moderate bilateral mastoid effusions. IMPRESSION: 1. No evidence of acute intracranial abnormality on this motion limited exam. 2. Nonspecific scattered paranasal sinus mucosal thickening with air-fluid levels in the left maxillary and right sphenoid sinuses. Correlate with the presence or absence of signs/symptoms of sinusitis. 3. Moderate bilateral mastoid effusions. Electronically Signed   By: Feliberto Harts MD   On: 10/24/2020 10:28        Scheduled Meds: . chlorhexidine  15 mL Mouth Rinse BID  . cyanocobalamin  1,000 mcg Subcutaneous Q1200  . enoxaparin (LOVENOX) injection  40 mg Subcutaneous Daily  . folic acid  1 mg Per Tube Daily  . free water  200 mL Per Tube Q4H  . insulin aspart  0-15 Units Subcutaneous Q4H  . insulin detemir  5 Units Subcutaneous BID  . mouth rinse  15 mL Mouth Rinse q12n4p  . multivitamin  15 mL Per Tube Daily  . pyridOXINE  100 mg Intravenous Daily  . saccharomyces boulardii  250 mg Per Tube BID  . thiamine  100 mg Per Tube Daily   Continuous Infusions: . feeding supplement (JEVITY 1.2 CAL) 1,000 mL (10/24/20 1535)     LOS: 13 days     Jacquelin Hawking, MD [facility] 10/24/2020, 5:30 PM  If 7PM-7AM, please contact night-coverage [email or web address]

## 2020-10-24 NOTE — Progress Notes (Signed)
Occupational Therapy Treatment Patient Details Name: Joseph Crawford MRN: [id number] DOB: [birth year] Today's Date: 10/24/2020    History of present illness 70 y.o. 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 Pt found COVID + Pulse ox 86% on RA. Chest x-ray showed vague patchy airspace opacities in the left base. Admitted 10-23-20 for treatment of COVID. Pt began with confusion 1/22 and head CT negative. Concern for aspiration PNA.   OT comments  Pt with limited participation this date, lethargic throughout with intermittent attempts at verbalizations and opening of eyes. Following ~25% of 1 step commands for B UE strengthening activities at bed level. Nursing aware of state of arousal. Deferred OOB for safety this date. Attempted grooming for washing hands/face but unable to complete without heavy hand over hand assist. OT will continue to follow acutely, with current recommendations appropriate.    Follow Up Recommendations  SNF;Supervision/Assistance - 24 hour    Equipment Recommendations  Wheelchair (measurements OT);Wheelchair cushion (measurements OT);Hospital bed;3 in 1 bedside commode    Recommendations for Other Services      Precautions / Restrictions Precautions Precautions: Fall Precaution Comments: cortrak, B mittens        Cognition Arousal/Alertness: Lethargic (unable to susatin alertness throughout session) Behavior During Therapy: Flat affect Overall Cognitive Status: Impaired/Different from baseline Area of Impairment: Orientation;Memory;Following commands;Safety/judgement;Awareness;Problem solving;Attention                 Orientation Level: Disoriented to;Place;Time;Situation Current  Attention Level: Focused Memory: Decreased short-term memory Following Commands: Follows one step commands inconsistently;Follows one step commands with increased time Safety/Judgement: Decreased awareness of safety;Decreased awareness of deficits Awareness: Intellectual Problem Solving: Slow processing;Decreased initiation;Difficulty sequencing;Requires verbal cues;Requires tactile cues General Comments: no family or staff present in room. noted telesitter on.        Exercises Exercises: General Upper Extremity General Exercises - Upper Extremity Shoulder Flexion: AAROM;10 reps;Supine Shoulder ABduction: AAROM;10 reps;Supine Elbow Flexion: AAROM;10 reps;Supine Elbow Extension: AAROM;10 reps;Supine           Pertinent Vitals/ Pain       Pain Assessment: Faces Faces Pain Scale: No hurt         Frequency  Min 1X/week        Progress Toward Goals  OT Goals(current goals can now be found in the care plan section)     Acute Rehab OT Goals Patient Stated Goal: none stated Time For Goal Achievement: 10/31/20 Potential to Achieve Goals: Fair  Plan Discharge plan remains appropriate    Co-evaluation                 AM-PAC OT "6 Clicks" Daily Activity     Outcome Measure   Help from another person eating meals?: Total Help from another person taking care of personal grooming?: Total Help from another person toileting, which includes using toliet, bedpan, or urinal?: Total Help from another person bathing (including washing, rinsing, drying)?: Total Help from another person to put on and taking off regular upper body clothing?: Total Help from another person to put on and taking off regular lower body clothing?: Total 6 Click Score: 6    End of Session Equipment Utilized During Treatment: Oxygen  OT Visit Diagnosis: Unsteadiness on feet (R26.81);Other abnormalities of gait and  mobility (R26.89);Muscle weakness (generalized) (M62.81);Other symptoms and signs  involving cognitive function   Activity Tolerance Patient limited by lethargy   Patient Left in bed;with call bell/phone within reach;with bed alarm set   Nurse Communication Mobility status    Time: 6979-4801 OT Time Calculation (min): 17 min  Charges: OT General Charges $OT Visit: 1 Visit OT Treatments $Therapeutic Exercise: 8-22 mins  Chibueze Beasley OTR/L acute rehab services Office: [phone]  Wilhemena Durie 10/24/2020, 2:58 PM

## 2020-10-24 NOTE — Progress Notes (Addendum)
Nutrition Follow-up  DOCUMENTATION CODES:   Not applicable  INTERVENTION:    Continue tube feeding via Cortrak:  Jevity 1.2 at 75 ml/h (1800 ml per day) provides 2160 kcal, 100 gm protein, 1458 ml free water daily   No BM documented in 10 days, consider adding scheduled bowel regimen.   Recommend adding scheduled Novolog TF coverage q 4 hours in addition to SSI per Diabetes Coordinator recommendation.   NUTRITION DIAGNOSIS:   Increased nutrient needs related to catabolic illness ([id number]) as evidenced by estimated needs.  Ongoing  GOAL:   Patient will meet greater than or equal to 90% of their needs  Met with TF  MONITOR:   TF tolerance,Labs,Diet advancement  REASON FOR ASSESSMENT:   Consult Enteral/tube feeding initiation and management  ASSESSMENT:   70 yo male admitted with worsening dyspnea, fever, and chills; tested positive for COVID-19. PMH includes DM-2, CAD, HLD, HTN, tobacco abuse.   Patient remains NPO. He is receiving TF of Jevity 1.2 at 75 ml/h via Cortrak tube; tolerating well. Free water flushes 200 ml every 4 hours. Tip of Cortrak is gastric.   Labs reviewed.  CBG: 176-214  Medications reviewed and include vitamin Q-30, folic acid, Novolog SSI q 4 h, Levemir liquid MVI, vitamin B-6, Florastor, thiamine. IVF: D5 with KCl at 100 ml/h  Admission weight 79.4 kg Currently 81.1 kg (1/31)  Palliative Care team has been consulted. Noted, may transition to residential hospice if no improvement in mentation in the next few days.   Diet Order:   Diet Order            Diet NPO time specified  Diet effective now                 EDUCATION NEEDS:   Not appropriate for education at this time  Skin:  Skin Assessment: Reviewed RN Assessment  Last BM:  1/23 type 4  Height:   Ht Readings from Last 1 Encounters:  10/18/2020 6' (1.829 m)    Weight:   Wt Readings from Last 1 Encounters:  10/22/20 81.1 kg    Ideal Body Weight:  80.9  kg  BMI:  Body mass index is 24.25 kg/m.  Estimated Nutritional Needs:   Kcal:  2100-2300  Protein:  95-120 gm  Fluid:  >/= 2.2 L    Lucas Mallow, RD, LDN, CNSC Please refer to Amion for contact information.

## 2020-10-24 NOTE — Progress Notes (Signed)
Patient had 16 beats Vtach per cardiac monitor.Patient is awake, responds to name , on assessment .  B/p 124/75, HR 68 , O2 sat 95% on 2l/m [facility]. Dr Martyn Malay notified. Md also made aware  Patient with episodes of intermittent fluctuating HR fr 38-90 . Potassium ordered, continue to monitor patient.

## 2020-10-24 NOTE — Consult Note (Signed)
Consultation Note Date: 10/24/2020   Patient Name: Joseph Crawford  DOB: 1951-01-15  MRN: [id number]  Age / Sex: 70 y.o., male  PCP: Hurshel Party, NP Referring Physician: Narda Bonds, MD  Reason for Consultation: Establishing goals of care  HPI/Patient Profile: 70 y.o. male  with past medical history of DM, CAD s/p CABG x4, HLD, HTN, tobacco use admitted on 10/06/2020 with dyspnea. Hospital admission for COVID-19 pneumonia. Hospitalization complicated by significant encephalopathy, dysphagia/poor oral intake requiring cortrak and tube feeds, severe vitamin B12 deficiency. Receiving medical management. Palliative medicine consultation for goals of care.   Clinical Assessment and Goals of Care:  I have reviewed medical records, discussed with Dr. Caleb Popp and RN and spoke with daughters Misty Stanley and Larita Fife) via telephone to discuss goals of care.   I introduced Palliative Medicine as specialized medical care for people living with serious illness. It focuses on providing relief from the symptoms and stress of a serious illness. The goal is to improve quality of life for both the patient and the family.  Patient is widowed. Two daughters. Lives with Misty Stanley. Prior to hospitalization, daughters report he was fairly independent and still driving. He did experience a fall the week before admission. Daughters report decreased oral intake in the last 6 months but contribute this to his metformin.   Discussed events leading up to admission and course of hospitalization including diagnoses, interventions, plan of care. Reviewed labs, medications, recommendations from specialists.    Since Jyquan is unable to make medical decisions for himself at this time, daughters make decisions jointly. They confirm decision against CPR/life support if his condition worsened.   Daughters are confused with where to go from here, especially  since their father was functional prior to admission. Daughters wonder if he needs more time for outcomes with vitamin B12 deficiency. They remain hopeful for improvement but open to hospice if decline, sharing their conversations with Dr. Allena Katz. They hope to hear from Dr. Caleb Popp in the next day or so to discuss.   Reassured daughters of ongoing palliative support this admission. PMT contact information given.      SUMMARY OF RECOMMENDATIONS    Continue current plan of care and medical management.  Daughters confirm partial code status--NO CPR, NO intubation/mechanical ventilation  Watchful waiting. Time for outcomes.  Daughters requesting to speak with attending. Notified Dr. Caleb Popp who will reach out to them when available.   PMT will continue to follow.   Code Status/Advance Care Planning:  Limited code  Symptom Management:   Per attending  Palliative Prophylaxis:   Aspiration, Delirium Protocol, Oral Care and Turn Reposition  Psycho-social/Spiritual:   Desire for further Chaplaincy support: yes  Additional Recommendations: Caregiving  Support/Resources, Compassionate Wean Education and Education on Hospice  Prognosis:   Guarded to poor   Discharge Planning: To Be Determined      Primary Diagnoses: Present on Admission: . Acute hypoxemic respiratory failure due to COVID-19 [facility])   I have reviewed the medical record, interviewed the patient and family,  and examined the patient. The following aspects are pertinent.  Past Medical History:  Diagnosis Date  . Diabetes (HCC)   . Hyperlipidemia   . Hypertension    Social History   Socioeconomic History  . Marital status: Widowed    Spouse name: Not on file  . Number of children: Not on file  . Years of education: Not on file  . Highest education level: Not on file  Occupational History  . Not on file  Tobacco Use  . Smoking status: Heavy Tobacco Smoker    Packs/day: 2.00    Years: 40.00    Pack years:  80.00  . Smokeless tobacco: Never Used  Substance and Sexual Activity  . Alcohol use: Not on file  . Drug use: Not on file  . Sexual activity: Not on file  Other Topics Concern  . Not on file  Social History Narrative  . Not on file   Social Determinants of Health   Financial Resource Strain: Not on file  Food Insecurity: Not on file  Transportation Needs: Not on file  Physical Activity: Not on file  Stress: Not on file  Social Connections: Not on file   History reviewed. No pertinent family history. Scheduled Meds: . chlorhexidine  15 mL Mouth Rinse BID  . cyanocobalamin  1,000 mcg Subcutaneous Q1200  . enoxaparin (LOVENOX) injection  40 mg Subcutaneous Daily  . folic acid  1 mg Per Tube Daily  . free water  200 mL Per Tube Q4H  . insulin aspart  0-15 Units Subcutaneous Q4H  . insulin detemir  5 Units Subcutaneous BID  . mouth rinse  15 mL Mouth Rinse q12n4p  . multivitamin  15 mL Per Tube Daily  . pyridOXINE  100 mg Intravenous Daily  . saccharomyces boulardii  250 mg Per Tube BID  . thiamine  100 mg Per Tube Daily   Continuous Infusions: . feeding supplement (JEVITY 1.2 CAL) 1,000 mL (10/24/20 1535)   PRN Meds:.acetaminophen, [DISCONTINUED] ondansetron **OR** ondansetron (ZOFRAN) IV Medications Prior to Admission:  Prior to Admission medications   Medication Sig Start Date End Date Taking? Authorizing Provider  aspirin 325 MG tablet Take 325 mg by mouth daily.   Yes [provider]  fenofibrate 160 MG tablet Take 160 mg by mouth daily. 09/18/20  Yes [provider]  losartan-hydrochlorothiazide (HYZAAR) 50-12.5 MG tablet Take 1 tablet by mouth daily. 10/03/20  Yes [provider]  metFORMIN (GLUCOPHAGE) 850 MG tablet Take 850 mg by mouth 2 (two) times daily. 07/20/20  Yes [provider]  metoprolol succinate (TOPROL-XL) 25 MG 24 hr tablet Take 25 mg by mouth 2 (two) times daily. 08/04/20  Yes [provider]  Omega-3 Fatty  Acids (FISH OIL PO) Take 3 capsules by mouth daily.   Yes [provider]  pantoprazole (PROTONIX) 20 MG tablet Take 20 mg by mouth daily. 08/04/20  Yes [provider]   Allergies  Allergen Reactions  . Statins Other (See Comments)    Per pt: muscle loss and weakness    Review of Systems  Unable to perform ROS: Acuity of condition   Physical Exam Vitals and nursing note reviewed.    Vital Signs: BP (!) 116/57 (BP Location: Right Arm)   Pulse 76   Temp 98.7 F (37.1 C) (Axillary)   Resp 18   Ht 6' (1.829 m)   Wt 81.1 kg   SpO2 98%   BMI 24.25 kg/m  Pain Scale: 0-10   Pain  Score: 0-No pain   SpO2: SpO2: 98 % O2 Device:SpO2: 98 % O2 Flow Rate: .O2 Flow Rate (L/min): 3 L/min  IO: Intake/output summary:   Intake/Output Summary (Last 24 hours) at 10/24/2020 1638 Last data filed at 10/24/2020 1538 Gross per 24 hour  Intake -  Output 1820 ml  Net -1820 ml    LBM: Last BM Date: 10/22/20 Baseline Weight: Weight: 79.4 kg Most recent weight: Weight: 81.1 kg     Palliative Assessment/Data: PPS 30%    Time Total: 50 Greater than 50%  of this time was spent counseling and coordinating care related to the above assessment and plan.  The above conversation was completed via telephone due to visitor restrictions during COVID-19 pandemic. Thorough chart review and discussion with multidisciplinary team was completed as part of assessment. No physical examination was performed.   Signed by:  Vennie Homans, DNP, FNP-C Palliative Medicine Team  Phone: [phone] Fax: [phone]   Please contact Palliative Medicine Team phone at [phone] for questions and concerns.  For individual provider: See Loretha Stapler

## 2020-10-25 DIAGNOSIS — I10 Essential (primary) hypertension: Secondary | ICD-10-CM | POA: Diagnosis not present

## 2020-10-25 DIAGNOSIS — J9601 Acute respiratory failure with hypoxia: Secondary | ICD-10-CM | POA: Diagnosis not present

## 2020-10-25 DIAGNOSIS — E538 Deficiency of other specified B group vitamins: Secondary | ICD-10-CM | POA: Diagnosis not present

## 2020-10-25 DIAGNOSIS — U071 COVID-19: Secondary | ICD-10-CM | POA: Diagnosis not present

## 2020-10-25 LAB — INTRINSIC FACTOR ANTIBODIES: Intrinsic Factor: 1.1 AU/mL (ref 0.0–1.1)

## 2020-10-25 LAB — GLUCOSE, CAPILLARY
Glucose-Capillary: 187 mg/dL — ABNORMAL HIGH (ref 70–99)
Glucose-Capillary: 213 mg/dL — ABNORMAL HIGH (ref 70–99)
Glucose-Capillary: 201 mg/dL — ABNORMAL HIGH (ref 70–99)
Glucose-Capillary: 141 mg/dL — ABNORMAL HIGH (ref 70–99)
Glucose-Capillary: 164 mg/dL — ABNORMAL HIGH (ref 70–99)

## 2020-10-25 LAB — CBC
HCT: 27.6 % — ABNORMAL LOW (ref 39.0–52.0)
Hemoglobin: 9.1 g/dL — ABNORMAL LOW (ref 13.0–17.0)
MCH: 36.3 pg — ABNORMAL HIGH (ref 26.0–34.0)
MCHC: 33 g/dL (ref 30.0–36.0)
MCV: 110 fL — ABNORMAL HIGH (ref 80.0–100.0)
Platelets: 210 10*3/uL (ref 150–400)
RBC: 2.51 MIL/uL — ABNORMAL LOW (ref 4.22–5.81)
RDW: 18.9 % — ABNORMAL HIGH (ref 11.5–15.5)
WBC: 9.8 10*3/uL (ref 4.0–10.5)
nRBC: 0.3 % — ABNORMAL HIGH (ref 0.0–0.2)

## 2020-10-25 LAB — BASIC METABOLIC PANEL
Anion gap: 7 (ref 5–15)
BUN: 19 mg/dL (ref 8–23)
CO2: 34 mmol/L — ABNORMAL HIGH (ref 22–32)
Calcium: 9.6 mg/dL (ref 8.9–10.3)
Chloride: 101 mmol/L (ref 98–111)
Creatinine, Ser: 0.72 mg/dL (ref 0.61–1.24)
GFR, Estimated: >60 mL/min (ref >60)
Glucose, Bld: 170 mg/dL — ABNORMAL HIGH (ref 70–99)
Potassium: 3.9 mmol/L (ref 3.5–5.1)
Sodium: 142 mmol/L (ref 135–145)

## 2020-10-25 LAB — MAGNESIUM: Magnesium: 2.1 mg/dL (ref 1.7–2.4)

## 2020-10-25 MED ORDER — SODIUM CHLORIDE 0.9 % IV SOLN
INTRAVENOUS | Status: DC | PRN
  Administered 2020-10-25: 250 mL via INTRAVENOUS

## 2020-10-25 MED ORDER — THIAMINE HCL 100 MG/ML IJ SOLN
400.0000 mg | Freq: Three times a day (TID) | INTRAVENOUS | Status: DC
  Administered 2020-10-25: 400 mg via INTRAVENOUS
  Filled 2020-10-25 (×2): qty 4

## 2020-10-25 MED ORDER — THIAMINE HCL 100 MG/ML IJ SOLN
400.0000 mg | Freq: Three times a day (TID) | INTRAVENOUS | Status: DC
  Administered 2020-10-26 (×3): 400 mg via INTRAVENOUS
  Filled 2020-10-25 (×6): qty 4

## 2020-10-25 MED ORDER — THIAMINE HCL 100 MG/ML IJ SOLN: 100.0000 mg | Freq: Three times a day (TID) | INTRAMUSCULAR | Status: DC

## 2020-10-25 NOTE — Progress Notes (Signed)
PROGRESS NOTE    Joseph Crawford  BWG:[id number] DOB: 03/20/1951 DOA: 09/23/2020 PCP: Hurshel Party, NP   Brief Narrative: Joseph Crawford is a 70 y.o. male with a history of diabetes mellitus, CAD s/p CABG x4, hyperlipidemia, hypertension, tobacco use. Patient presented secondary to worsening dyspnea in setting of COVID-19 pneumonia. Hospitalization complicated by significant encephalopathy possibly secondary to severe vitamin B12 deficiency.   Assessment & Plan:   Principal Problem:   Acute hypoxemic respiratory failure due to COVID-19 [facility]) Active Problems:   Acute metabolic encephalopathy   Vitamin B12 deficiency   Dysphagia   Primary hypertension   Uncontrolled type 2 diabetes mellitus with hyperglycemia (HCC)   Acute respiratory failure with hypoxia Secondary to COVID-19 pneumonia. Still requiring 2-3 L of oxygen -Wean to room air as able -Keep O2 saturation >92%  Acute metabolic encephalopathy Possibly secondary to severe vitamin B12 deficiency. EEG without evidence of seizure. MRI without evidence of acute pathology/stroke. ABG without hypercapnia. TSH and folic acid normal. Ammonia slightly elevated at 44. NG tube in place for feeding. -Continue Vitamin B12 treatment -Continue B6, thiamine, folic acid -Neurology recommendations: high dose thiamine -B1 pending  Vitamin B12 deficiency Severe.  -Intrinsic factor ab pending -Continue Vitamin B12 1000 mg subcu daily  COVID-19 pneumonia Chest x-ray with bilateral opacities. Patient treated with Remdesivir, steroids and oxygen. Baricitinib not given secondary to concern for bacterial infection. CRP trended down.  Dysphagia Secondary to encephalopathy. NG tube placed (small bore) -Continue tube feeds  Diabetes mellitus, type 2 -Continue Levemir and SSI  Primary hypertension Well controlled.  Thrombocytopenia Mild. No active bleeding. In setting of acute infection. Resolved.  GERD No symptoms. Not on  pharmacotherapy.  Hypernatremia Mild. Improved with free water.  Superficial venous thrombosis Right upper extremity. Supportive care.  Sinus bradycardia PVCs Frequent NSVT -Continue telemetry   DVT prophylaxis: Lovenox Code Status:   Code Status: Partial Code Family Communication: None at bedside Disposition Plan: Discharge pending improvement of encephalopathy vs possible discharge to SNF vs LTACH if plan to allow treatment of B12 deficiency   Consultants:   Neurology  Palliative care medicine  Procedures:   TRANSTHORACIC ECHOCARDIOGRAM (10/20/2020) IMPRESSIONS    1. Left ventricular ejection fraction, by estimation, is 65 to 70%. The  left ventricle has normal function. The left ventricle has no regional  wall motion abnormalities. Left ventricular diastolic parameters are  consistent with Grade I diastolic  dysfunction (impaired relaxation).  2. Left atrial size was mild to moderately dilated.  3. The mitral valve is normal in structure. No evidence of mitral valve  regurgitation.  4. The aortic valve is grossly normal. Aortic valve regurgitation is not  visualized. Mild aortic valve sclerosis is present, with no evidence of  aortic valve stenosis.  5. Tricuspid regurgitation signal is inadequate for assessing PA  pressure.   Antimicrobials:  Vancomycin  Remdesivir  Ceftazidime  Unasyn    Subjective: Cannot provide history.  Objective: Vitals:   10/25/20 0319 10/25/20 0455 10/25/20 0719 10/25/20 1327  BP: 105/83  (!) 117/51 125/71  Pulse: 68  97 81  Resp:   11 (!) 23  Temp: 98.9 F (37.2 C)  98.8 F (37.1 C) 98.1 F (36.7 C)  TempSrc: Axillary  Axillary Oral  SpO2: 94%  96% 97%  Weight:  80.9 kg    Height:        Intake/Output Summary (Last 24 hours) at 10/25/2020 1555 Last data filed at 10/25/2020 0528 Gross per 24 hour  Intake 2086 ml  Output 1800 ml  Net 286 ml   Filed Weights   10/21/20 0437 10/22/20 0500 10/25/20 0455   Weight: 80.4 kg 81.1 kg 80.9 kg    Examination:  General exam: Appears calm and comfortable Respiratory system: Clear to auscultation. Respiratory effort normal. Cardiovascular system: S1 & S2 heard, RRR. No murmurs, rubs, gallops or clicks. Gastrointestinal system: Abdomen is nondistended, soft and nontender. No organomegaly or masses felt. Normal bowel sounds heard. Central nervous system: Alert. Does not appear to be following commands appropriately. Tracks me when he is alert. Musculoskeletal: No edema. No calf tenderness Skin: No cyanosis. No rashes Psychiatry: Judgement and insight appear impaired   Data Reviewed: I have personally reviewed following labs and imaging studies  CBC Lab Results  Component Value Date   WBC 9.8 10/25/2020   RBC 2.51 (L) 10/25/2020   HGB 9.1 (L) 10/25/2020   HCT 27.6 (L) 10/25/2020   MCV 110.0 (H) 10/25/2020   MCH 36.3 (H) 10/25/2020   PLT 210 10/25/2020   MCHC 33.0 10/25/2020   RDW 18.9 (H) 10/25/2020   LYMPHSABS 1.2 10/16/2020   MONOABS 0.3 10/16/2020   EOSABS 0.0 10/16/2020   BASOSABS 0.0 10/16/2020     Last metabolic panel Lab Results  Component Value Date   NA 142 10/25/2020   K 3.9 10/25/2020   CL 101 10/25/2020   CO2 34 (H) 10/25/2020   BUN 19 10/25/2020   CREATININE 0.72 10/25/2020   GLUCOSE 170 (H) 10/25/2020   GFRNONAA >60 10/25/2020   CALCIUM 9.6 10/25/2020   PROT 5.8 (L) 10/16/2020   ALBUMIN 2.1 (L) 10/16/2020   BILITOT 2.7 (H) 10/16/2020   ALKPHOS 27 (L) 10/16/2020   AST 23 10/16/2020   ALT 27 10/16/2020   ANIONGAP 7 10/25/2020    CBG (last 3)  Recent Labs    10/25/20 0351 10/25/20 0812 10/25/20 1324  GLUCAP 187* 213* 201*     GFR: Estimated Creatinine Clearance: 94.3 mL/min (by C-G formula based on SCr of 0.72 mg/dL).  Coagulation Profile: No results for input(s): INR, PROTIME in the last 168 hours.  Recent Results (from the past 240 hour(s))  MRSA PCR Screening     Status: None   Collection  Time: 10/17/20  5:44 PM   Specimen: Nasopharyngeal Wash  Result Value Ref Range Status   MRSA by PCR NEGATIVE NEGATIVE Final    Comment:        The GeneXpert MRSA Assay (FDA approved for NASAL specimens only), is one component of a comprehensive MRSA colonization surveillance program. It is not intended to diagnose MRSA infection nor to guide or monitor treatment for MRSA infections. Performed at [facility], [address] [place], [place] [address]         Radiology Studies: MR BRAIN WO CONTRAST  Result Date: 10/24/2020 CLINICAL DATA:  Mental status change, unknown cause. EXAM: MRI HEAD WITHOUT CONTRAST TECHNIQUE: Multiplanar, multiecho pulse sequences of the brain and surrounding structures were obtained without intravenous contrast. COMPARISON:  CT head 10/16/2020.  MRI December 20, 2003. FINDINGS: Motion limited study. Brain: No acute infarction, hemorrhage, hydrocephalus, extra-axial collection or mass lesion. Moderate diffuse cerebral atrophy, progressed since 2005. Mild for age T2/FLAIR hyperintensities in the white matter, likely related to chronic microvascular ischemic disease. Vascular: Major arterial flow voids are maintained at the skull base. Skull and upper cervical spine: Normal marrow signal. Sinuses/Orbits: Scattered paranasal sinus mucosal thickening with air-fluid levels in the left maxillary sinus and right  sphenoid sinus. Other: Moderate bilateral mastoid effusions. IMPRESSION: 1. No evidence of acute intracranial abnormality on this motion limited exam. 2. Nonspecific scattered paranasal sinus mucosal thickening with air-fluid levels in the left maxillary and right sphenoid sinuses. Correlate with the presence or absence of signs/symptoms of sinusitis. 3. Moderate bilateral mastoid effusions. Electronically Signed   By: Feliberto Harts MD   On: 10/24/2020 10:28        Scheduled Meds: . chlorhexidine  15 mL Mouth Rinse BID  . cyanocobalamin  1,000 mcg  Subcutaneous Q1200  . enoxaparin (LOVENOX) injection  40 mg Subcutaneous Daily  . folic acid  1 mg Per Tube Daily  . free water  200 mL Per Tube Q4H  . insulin aspart  0-15 Units Subcutaneous Q4H  . insulin detemir  5 Units Subcutaneous BID  . mouth rinse  15 mL Mouth Rinse q12n4p  . multivitamin  15 mL Per Tube Daily  . pyridOXINE  100 mg Intravenous Daily  . saccharomyces boulardii  250 mg Per Tube BID  . thiamine  100 mg Per Tube Daily   Continuous Infusions: . feeding supplement (JEVITY 1.2 CAL) 1,000 mL (10/25/20 0624)     LOS: 14 days     Jacquelin Hawking, MD [facility] 10/25/2020, 3:55 PM  If 7PM-7AM, please contact night-coverage [email or web address]

## 2020-10-25 NOTE — Progress Notes (Signed)
Physical Therapy Treatment Patient Details Name: Joseph Crawford MRN: [id number] DOB: [birth year] Today's Date: 10/25/2020    History of Present Illness 70 y.o. 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 Pt found COVID + Pulse ox 86% on RA. Chest x-ray showed vague patchy airspace opacities in the left base. Admitted 10/20/2020 for treatment of COVID. Pt began with confusion 1/22 and head CT negative. Concern for aspiration PNA.    PT Comments    Pt more alert at start of session today but fatigued and more lethargic fairly quickly. Pt continues to be limited by cognition and lethargy. Continue to recommend SNF at DC.    Follow Up Recommendations  SNF     Equipment Recommendations  Rolling walker with 5" wheels;Wheelchair (measurements PT);Wheelchair cushion (measurements PT);Hospital bed (hoyer lift)    Recommendations for Other Services       Precautions / Restrictions Precautions Precautions: Fall Precaution Comments: cortrak    Mobility  Bed Mobility Overal bed mobility: Needs Assistance Bed Mobility: Supine to Sit;Sit to Sidelying;Rolling Rolling: Total assist   Supine to sit: Mod assist;HOB elevated Sit to supine: Mod assist   General bed mobility comments: Assist to bring legs off of bed and to initiate elevating trunk into sitting. Once pt initiated trunk elevation he was able to complete with min assist. Assist to bring hips to EOB.  Transfers                 General transfer comment: Attempted to stand with walker and with using back rest of chair but pt with no initiation or limited initiation and unable to stand with 1 person assist.  Ambulation/Gait                 Stairs              Wheelchair Mobility    Modified Rankin (Stroke Patients Only)       Balance Overall balance assessment: Needs assistance Sitting-balance support: Bilateral upper extremity supported;Feet supported Sitting balance-Leahy Scale: Poor Sitting balance - Comments: UE support and min to min guard assist for static sitting Postural control: Left lateral lean                                  Cognition Arousal/Alertness: Lethargic;Awake/alert (initially awake and alert but became more lethargic as session progressed) Behavior During Therapy: Flat affect Overall Cognitive Status: Impaired/Different from baseline Area of Impairment: Orientation;Memory;Following commands;Safety/judgement;Awareness;Problem solving;Attention                 Orientation Level: Disoriented to;Place;Time;Situation Current Attention Level: Focused Memory: Decreased short-term memory Following Commands: Follows one step commands inconsistently;Follows one step commands with increased time Safety/Judgement: Decreased awareness of safety;Decreased awareness of deficits Awareness: Intellectual Problem Solving: Slow processing;Decreased initiation;Difficulty sequencing;Requires verbal cues;Requires tactile cues General Comments: Initially when pt alert was following ~30% of commands with multimodal cuing. Then as he became more lethargic stopped following commands.      Exercises      General Comments        Pertinent Vitals/Pain Pain Assessment: Faces Faces Pain Scale: No hurt    Home Living  Prior Function            PT Goals (current goals can now be found in the care plan section) Acute Rehab PT Goals Patient Stated Goal: none stated Progress towards PT goals: Not progressing toward goals - comment    Frequency    Min 2X/week      PT Plan Current plan remains appropriate;Frequency needs to be updated    Co-evaluation               AM-PAC PT "6 Clicks" Mobility   Outcome Measure  Help needed turning from your back to your side while in a flat bed without using bedrails?: Total Help needed moving from lying on your back to sitting on the side of a flat bed without using bedrails?: Total Help needed moving to and from a bed to a chair (including a wheelchair)?: Total Help needed standing up from a chair using your arms (e.g., wheelchair or bedside chair)?: Total Help needed to walk in hospital room?: Total Help needed climbing 3-5 steps with a railing? : Total 6 Click Score: 6    End of Session Equipment Utilized During Treatment: Gait belt Activity Tolerance: Patient limited by lethargy;Patient limited by fatigue Patient left: in bed;with call bell/phone within reach;with bed alarm set Nurse Communication: Mobility status PT Visit Diagnosis: Unsteadiness on feet (R26.81);Other abnormalities of gait and mobility (R26.89);Muscle weakness (generalized) (M62.81)     Time: 1440-1510 PT Time Calculation (min) (ACUTE ONLY): 30 min  Charges:  $Therapeutic Activity: 23-37 mins                     [facility] PT Acute Rehabilitation Services Pager [phone] Office [phone]    Angelina Ok [facility] 10/25/2020, 3:25 PM

## 2020-10-25 NOTE — Progress Notes (Signed)
Neurology Progress Note:   Subjective: No acute overnight events, patient continues to be encephalopathic.  Exam: Vitals:   10/25/20 0319 10/25/20 0719  BP: 105/83 (!) 117/51  Pulse: 68 97  Resp:  11  Temp: 98.9 F (37.2 C) 98.8 F (37.1 C)  SpO2: 94% 96%   Gen: Laying in bed, no acute distress, grunting/mubmling randomly.  Resp: non-labored breathing with congested cough  Abd: soft, non-distended, non-tender  NEURO:  Mental Status: Awake, alert. States his name for examiner but he hs garbled speech that is difficult to understand but with multiple attempts hears his name correctly. States that it is 2021 but does not attempt to answer other questions. When asked the month he just states "I do not know" repetitively. Other than that, he just moans.  Speech/Language: speech is not fluent. Comprehension very slowed.  Cranial Nerves:  II: PERRL 3 mm/brisk.  III, IV, VI: Tracks examiner and fixates on objects placed in front of him but does not fully attend to horizontal visual fields. Lid elevation symmetric and full.  V: Blinks to threat. Able to open mouth.  VII: Face is symmetric resting.  VIII: Hhearing intact to voice IX, X: unable to assess. Cough present XI: Head is grossly midline.  XII: Patient does not protrude tongue for examiner on command Motor: He does resist passive movements of extremities but without attempt to move spontaneously or to command. No antigravity movement noted in upper extremities. Ankle flexion present 2/5 on bilateral lower extremities with consistent coaching.  Tone is mildly increased.  Sensation- Vocalization with stimulation in all 4 extremities. Coordination: Unable to assess, patient does not follow commands consistently, poor attention, unable to participate in coordination examination. Marland Kitchen  DTRs: unable to illicit due to resistance.  Gait- deferred  Pertinent Labs: CMP     Component Value Date/Time   NA 142 10/25/2020 0228   K 3.9  10/25/2020 0228   CL 101 10/25/2020 0228   CO2 34 (H) 10/25/2020 0228   GLUCOSE 170 (H) 10/25/2020 0228   BUN 19 10/25/2020 0228   CREATININE 0.72 10/25/2020 0228   CALCIUM 9.6 10/25/2020 0228   PROT 5.8 (L) 10/16/2020 0307   ALBUMIN 2.1 (L) 10/16/2020 0307   AST 23 10/16/2020 0307   ALT 27 10/16/2020 0307   ALKPHOS 27 (L) 10/16/2020 0307   BILITOT 2.7 (H) 10/16/2020 0307   GFRNONAA >60 10/25/2020 0228  CBC    Component Value Date/Time   WBC 9.8 10/25/2020 0228   RBC 2.51 (L) 10/25/2020 0228   HGB 9.1 (L) 10/25/2020 0228   HCT 27.6 (L) 10/25/2020 0228   PLT 210 10/25/2020 0228   MCV 110.0 (H) 10/25/2020 0228   MCH 36.3 (H) 10/25/2020 0228   MCHC 33.0 10/25/2020 0228   RDW 18.9 (H) 10/25/2020 0228   LYMPHSABS 1.2 10/16/2020 0307   MONOABS 0.3 10/16/2020 0307   EOSABS 0.0 10/16/2020 0307   BASOSABS 0.0 10/16/2020 0307   Lipid Panel     Component Value Date/Time   TRIG 269 (H) 2020/10/20 1532   Lab Results  Component Value Date   HGBA1C 6.4 (H) 10/12/2020   CT-head: No acute finding by CT. Age related atrophy and mild chronic small-vessel change of the white matter.  EEG: This study is suggestive of moderate diffuse encephalopathy, nonspecific etiology.No seizures or epileptiform discharges were seen throughout the recording.  MR Brain without contrast IMPRESSION: 1. No evidence of acute intracranial abnormality on this motion limited exam. 2. Nonspecific scattered paranasal sinus  mucosal thickening with  air-fluid levels in the left maxillary and right sphenoid sinuses. Correlate with the presence or absence of signs/symptoms of sinusitis. 3. Moderate bilateral mastoid effusions.  Assessment: 70 year old male with a long, complicated hospital stay who was found to have encephalopathy around 10/13/20, which has waxed and waned. Also, thought to have delirium and Seroquel was tried, but discontinued. Patient has multiple reasons to have encephalopathy, including COVID,  bacterial PNA, length of stay, metabolic derangement, B12 deficiency, and hyperglycemia. His encephalopathy could be due to steroids as well, and may improve as those are tapered.  MRI brain obtained without acute intracranial abnormality. Encephalopathy work-up negative for acute CT head, MRI brain, ammonia of 44, normal TSH, UA, EEG without seizures but with evidence of diffuse encephalopathy, and undetectable vitamin B12 levels.   Impression: - Toxic metabolic encephalopathy, multifactorial with superimposed delirium  Recommendations: - Continue to treat metabolic derangements, infection, hyperglycemia - Continue Vitamin B12 supplementation - Consider high dose IV thiamine 400 mg q8 IV x 3 days followed by 100 mg q8h IV while hospitalized, discharge on 100 mg PO daily - Do not believe LP is appropriate at this time but can consider LP studies if patient with neurologic decline or without improvement with correction of other metabolic derangements and infection - Try to minimize deliriogenic medications as much as possible (J Am Geriatr Soc. 2012 Apr;60(4):616-31): benzodiazepines, anticholinergics, diphenhydramine, antihistamines, narcotics, Ambien/Lunesta/Sonata etc. - Environmental support for delirium: Lights on during the day, patient up and out of bed as much as is feasible, OT/PT, quiet dimly lit room at night, reorient patient often, minimize sleep disruptions as much as possible overnight.   - Neurology will be available for any further questions or concerns, please feel free to contact consulting neurology team if needed.   Lanae Boast, AGACNP-BC [facility] [phone]

## 2020-10-26 LAB — BASIC METABOLIC PANEL
Anion gap: 9 (ref 5–15)
BUN: 18 mg/dL (ref 8–23)
CO2: 31 mmol/L (ref 22–32)
Calcium: 9.7 mg/dL (ref 8.9–10.3)
Chloride: 100 mmol/L (ref 98–111)
Creatinine, Ser: 0.66 mg/dL (ref 0.61–1.24)
GFR, Estimated: >60 mL/min (ref >60)
Glucose, Bld: 205 mg/dL — ABNORMAL HIGH (ref 70–99)
Potassium: 4 mmol/L (ref 3.5–5.1)
Sodium: 140 mmol/L (ref 135–145)

## 2020-10-26 LAB — GLUCOSE, CAPILLARY
Glucose-Capillary: 183 mg/dL — ABNORMAL HIGH (ref 70–99)
Glucose-Capillary: 204 mg/dL — ABNORMAL HIGH (ref 70–99)
Glucose-Capillary: 204 mg/dL — ABNORMAL HIGH (ref 70–99)
Glucose-Capillary: 218 mg/dL — ABNORMAL HIGH (ref 70–99)
Glucose-Capillary: 188 mg/dL — ABNORMAL HIGH (ref 70–99)
Glucose-Capillary: 196 mg/dL — ABNORMAL HIGH (ref 70–99)
Glucose-Capillary: 208 mg/dL — ABNORMAL HIGH (ref 70–99)
Glucose-Capillary: 192 mg/dL — ABNORMAL HIGH (ref 70–99)

## 2020-10-26 LAB — CBC
HCT: 27.6 % — ABNORMAL LOW (ref 39.0–52.0)
Hemoglobin: 9.4 g/dL — ABNORMAL LOW (ref 13.0–17.0)
MCH: 37.9 pg — ABNORMAL HIGH (ref 26.0–34.0)
MCHC: 34.1 g/dL (ref 30.0–36.0)
MCV: 111.3 fL — ABNORMAL HIGH (ref 80.0–100.0)
Platelets: 228 10*3/uL (ref 150–400)
RBC: 2.48 MIL/uL — ABNORMAL LOW (ref 4.22–5.81)
RDW: 18.6 % — ABNORMAL HIGH (ref 11.5–15.5)
WBC: 12.1 10*3/uL — ABNORMAL HIGH (ref 4.0–10.5)
nRBC: 0.2 % (ref 0.0–0.2)

## 2020-10-26 LAB — MAGNESIUM: Magnesium: 2.1 mg/dL (ref 1.7–2.4)

## 2020-10-26 NOTE — Progress Notes (Signed)
PROGRESS NOTE    Joseph Crawford  SWN:[id number] DOB: Mar 05, 1951 DOA: 10/19/2020 PCP: Hurshel Party, NP   Brief Narrative: Joseph Crawford is a 70 y.o. male with a history of diabetes mellitus, CAD s/p CABG x4, hyperlipidemia, hypertension, tobacco use. Patient presented secondary to worsening dyspnea in setting of COVID-19 pneumonia. Hospitalization complicated by significant encephalopathy possibly secondary to severe vitamin B12 deficiency.   Assessment & Plan:   Principal Problem:   Acute hypoxemic respiratory failure due to COVID-19 [facility]) Active Problems:   Acute metabolic encephalopathy   Vitamin B12 deficiency   Dysphagia   Primary hypertension   Uncontrolled type 2 diabetes mellitus with hyperglycemia (HCC)   Acute respiratory failure with hypoxia Secondary to COVID-19 pneumonia. Still requiring 3 L of oxygen -Wean to room air as able -Keep O2 saturation >92%  Acute metabolic encephalopathy Possibly secondary to severe vitamin B12 deficiency. EEG without evidence of seizure. MRI without evidence of acute pathology/stroke. ABG without hypercapnia. TSH and folic acid normal. Ammonia slightly elevated at 44. NG tube in place for feeding. -Continue Vitamin B12 treatment -Continue B6, thiamine, folic acid -Neurology recommendations: high dose thiamine, MMA/homocysteine/gastrin ordered 2/4 -B1 pending  Vitamin B12 deficiency Severe. Intrinsic factor ab test normal -Continue Vitamin B12 1000 mg subcu daily  COVID-19 pneumonia Chest x-ray with bilateral opacities. Patient treated with Remdesivir, steroids and oxygen. Baricitinib not given secondary to concern for bacterial infection. CRP trended down.  Dysphagia Secondary to encephalopathy. NG tube placed (small bore) -Continue tube feeds  Diabetes mellitus, type 2 -Continue Levemir and SSI  Primary hypertension Well controlled.  Thrombocytopenia Mild. No active bleeding. In setting of acute infection.  Resolved.  GERD No symptoms. Not on pharmacotherapy.  Hypernatremia Mild. Improved with free water.  Superficial venous thrombosis Right upper extremity. Supportive care.  Sinus bradycardia PVCs Frequent NSVT -Continue telemetry   DVT prophylaxis: Lovenox Code Status:   Code Status: Partial Code Family Communication: None at bedside Disposition Plan: Discharge pending improvement of encephalopathy vs possible discharge to SNF vs LTACH if plan to allow treatment of B12 deficiency pending neuro/family discussions   Consultants:   Neurology  Palliative care medicine  Procedures:   TRANSTHORACIC ECHOCARDIOGRAM (10/20/2020) IMPRESSIONS    1. Left ventricular ejection fraction, by estimation, is 65 to 70%. The  left ventricle has normal function. The left ventricle has no regional  wall motion abnormalities. Left ventricular diastolic parameters are  consistent with Grade I diastolic  dysfunction (impaired relaxation).  2. Left atrial size was mild to moderately dilated.  3. The mitral valve is normal in structure. No evidence of mitral valve  regurgitation.  4. The aortic valve is grossly normal. Aortic valve regurgitation is not  visualized. Mild aortic valve sclerosis is present, with no evidence of  aortic valve stenosis.  5. Tricuspid regurgitation signal is inadequate for assessing PA  pressure.   Antimicrobials:  Vancomycin  Remdesivir  Ceftazidime  Unasyn    Subjective: Patient cannot provide history.  Objective: Vitals:   10/26/20 0005 10/26/20 0456 10/26/20 0459 10/26/20 1120  BP: (!) 130/51 107/60  (!) 120/54  Pulse: 76 65  77  Resp: (!) 21 (!) 21  20  Temp: 99.6 F (37.6 C) 99.4 F (37.4 C)  (!) 97.4 F (36.3 C)  TempSrc: Oral Oral  Axillary  SpO2: 95% 96%  93%  Weight:   80.1 kg   Height:        Intake/Output Summary (Last 24 hours) at 10/26/2020 1316 Last  data filed at 10/26/2020 0142 Gross per 24 hour  Intake 2490.78 ml   Output 1300 ml  Net 1190.78 ml   Filed Weights   10/22/20 0500 10/25/20 0455 10/26/20 0459  Weight: 81.1 kg 80.9 kg 80.1 kg    Examination:  General exam: Appears calm and comfortable Respiratory system: Clear to auscultation. Respiratory effort normal. Cardiovascular system: S1 & S2 heard, RRR. No murmurs, rubs, gallops or clicks. Gastrointestinal system: Abdomen is nondistended, soft and nontender. No organomegaly or masses felt. Normal bowel sounds heard. Central nervous system: Alert and not oriented. Musculoskeletal: No edema. No calf tenderness Skin: No cyanosis. No rashes Psychiatry: Judgement and insight appear impaired.  Data Reviewed: I have personally reviewed following labs and imaging studies  CBC Lab Results  Component Value Date   WBC 12.1 (H) 10/26/2020   RBC 2.48 (L) 10/26/2020   HGB 9.4 (L) 10/26/2020   HCT 27.6 (L) 10/26/2020   MCV 111.3 (H) 10/26/2020   MCH 37.9 (H) 10/26/2020   PLT 228 10/26/2020   MCHC 34.1 10/26/2020   RDW 18.6 (H) 10/26/2020   LYMPHSABS 1.2 10/16/2020   MONOABS 0.3 10/16/2020   EOSABS 0.0 10/16/2020   BASOSABS 0.0 10/16/2020     Last metabolic panel Lab Results  Component Value Date   NA 140 10/26/2020   K 4.0 10/26/2020   CL 100 10/26/2020   CO2 31 10/26/2020   BUN 18 10/26/2020   CREATININE 0.66 10/26/2020   GLUCOSE 205 (H) 10/26/2020   GFRNONAA >60 10/26/2020   CALCIUM 9.7 10/26/2020   PROT 5.8 (L) 10/16/2020   ALBUMIN 2.1 (L) 10/16/2020   BILITOT 2.7 (H) 10/16/2020   ALKPHOS 27 (L) 10/16/2020   AST 23 10/16/2020   ALT 27 10/16/2020   ANIONGAP 9 10/26/2020    CBG (last 3)  Recent Labs    10/26/20 0447 10/26/20 0734 10/26/20 0836  GLUCAP 218* 204* 204*     GFR: Estimated Creatinine Clearance: 94.3 mL/min (by C-G formula based on SCr of 0.66 mg/dL).  Coagulation Profile: No results for input(s): INR, PROTIME in the last 168 hours.  Recent Results (from the past 240 hour(s))  MRSA PCR Screening      Status: None   Collection Time: 10/17/20  5:44 PM   Specimen: Nasopharyngeal Wash  Result Value Ref Range Status   MRSA by PCR NEGATIVE NEGATIVE Final    Comment:        The GeneXpert MRSA Assay (FDA approved for NASAL specimens only), is one component of a comprehensive MRSA colonization surveillance program. It is not intended to diagnose MRSA infection nor to guide or monitor treatment for MRSA infections. Performed at [facility], [address], [place], [place] [address]         Radiology Studies: No results found.      Scheduled Meds: . chlorhexidine  15 mL Mouth Rinse BID  . cyanocobalamin  1,000 mcg Subcutaneous Q1200  . enoxaparin (LOVENOX) injection  40 mg Subcutaneous Daily  . folic acid  1 mg Per Tube Daily  . free water  200 mL Per Tube Q4H  . insulin aspart  0-15 Units Subcutaneous Q4H  . insulin detemir  5 Units Subcutaneous BID  . mouth rinse  15 mL Mouth Rinse q12n4p  . multivitamin  15 mL Per Tube Daily  . pyridOXINE  100 mg Intravenous Daily  . saccharomyces boulardii  250 mg Per Tube BID  . [START ON 10/28/2020] thiamine injection  100 mg Intravenous  TID   Continuous Infusions: . sodium chloride 250 mL (10/25/20 1816)  . feeding supplement (JEVITY 1.2 CAL) 1,000 mL (10/25/20 2359)  . thiamine injection 400 mg (10/26/20 1118)     LOS: 15 days     Jacquelin Hawking, MD [facility] 10/26/2020, 1:16 PM  If 7PM-7AM, please contact night-coverage [email or web address]

## 2020-10-26 NOTE — Progress Notes (Signed)
See other note for 10/26/20

## 2020-10-26 NOTE — Care Management Important Message (Signed)
Important Message  Patient Details  Name: Joseph Crawford MRN: [id number] Date of Birth: June 29, 1951   Medicare Important Message Given:  Yes     Renie Ora 10/26/2020, 11:22 AM

## 2020-10-26 NOTE — Progress Notes (Signed)
Patient arrive on floor at 4:45.  Patient appears to be awake, with eyes open, but is snoring.  He has no blink reflex.  Awoke patinet with sternal rub.  Patient awoke, still does not track nor does he has blink reflex.  He did say, "hi".  IPatient has condom catheter, and urine was pooled around head of penis and appx. 1/3 of the way down tubing, but not draining to bag.  Applied gentle suction and urine moved down tube towards canister.  R. Arm has skin tear/abraisin on right arm near [facility].  Skin tear covered with mepilex.  Multiple,l scattered blisters on inside of right arm.  Bottom skin in tact and blanchable and has mepilex in place.. Bilateral heels have mepilex and skin is intact and blanchable.  Tube feed, Jevity 1.2 running at 75 mL/hr and free water programmed 200 mL every 4 hrs.  Coretrack in right nare.  Nares are clean, dry and intact.  Patient is wearing mittens to deter him from removing lines.

## 2020-10-26 NOTE — Progress Notes (Signed)
Brief Neurology progress note  Dr. Thomasena Edis called the patient's daughter Joseph Crawford [phone]  September 2021 his appetite started to decrease and he was only eating 2 meals per day. The ensuing months he became more withdrawn and sleepy taking naps during the day. He chose not to attend family gatherings on Thanks Giving and Christmas which was very "odd behavior" and very uncharacteristic for him. He started to get dizzy on standing which was thought to be orthostasis and his medication was adjusted accordingly.   Prior to hospitalization, the patient started to develop bilateral lower extremity weakness and complained of tingling in both his legs. This progressed to unsteadiness with falls necessitating walking with a walker and he ambulated with a shuffling gait. Days prior to admission, he was more somnolent and on the day he was admitted he complained his legs were not working and later went to the bathroom and could not recall how to pull his pants up.   He has a 36 pack year smoking history and cognitively intact.  Based on this new information will need to rule out other potential causes.  Daughter stated she would like to r/o other causes for encephalopathy before deciding on discharge disposition.   ABG to check hypercarbia.  Serum labs ordered.  Consider paraneoplastic syndrome. Continue Vit B12 and thiamine. LP possibly tomorrow with studies. Consider CT chest, abd, pelvis.   Will f/up again tomorrow.   Joseph Norman, NP/[place] Thomasena Edis, MD

## 2020-10-27 ENCOUNTER — Inpatient Hospital Stay (HOSPITAL_COMMUNITY): Payer: Medicare Other

## 2020-10-27 LAB — BASIC METABOLIC PANEL
Anion gap: 8 (ref 5–15)
BUN: 18 mg/dL (ref 8–23)
CO2: 33 mmol/L — ABNORMAL HIGH (ref 22–32)
Calcium: 9.8 mg/dL (ref 8.9–10.3)
Chloride: 98 mmol/L (ref 98–111)
Creatinine, Ser: 0.66 mg/dL (ref 0.61–1.24)
GFR, Estimated: >60 mL/min (ref >60)
Glucose, Bld: 201 mg/dL — ABNORMAL HIGH (ref 70–99)
Potassium: 4.8 mmol/L (ref 3.5–5.1)
Sodium: 139 mmol/L (ref 135–145)

## 2020-10-27 LAB — CBC
HCT: 28.8 % — ABNORMAL LOW (ref 39.0–52.0)
Hemoglobin: 9.5 g/dL — ABNORMAL LOW (ref 13.0–17.0)
MCH: 37 pg — ABNORMAL HIGH (ref 26.0–34.0)
MCHC: 33 g/dL (ref 30.0–36.0)
MCV: 112.1 fL — ABNORMAL HIGH (ref 80.0–100.0)
Platelets: 230 10*3/uL (ref 150–400)
RBC: 2.57 MIL/uL — ABNORMAL LOW (ref 4.22–5.81)
RDW: 17.8 % — ABNORMAL HIGH (ref 11.5–15.5)
WBC: 17.8 10*3/uL — ABNORMAL HIGH (ref 4.0–10.5)
nRBC: 0 % (ref 0.0–0.2)

## 2020-10-27 LAB — HOMOCYSTEINE: Homocysteine: 9.9 umol/L (ref 0.0–17.2)

## 2020-10-27 LAB — MAGNESIUM: Magnesium: 2.2 mg/dL (ref 1.7–2.4)

## 2020-10-27 LAB — GLUCOSE, CAPILLARY
Glucose-Capillary: 203 mg/dL — ABNORMAL HIGH (ref 70–99)
Glucose-Capillary: 174 mg/dL — ABNORMAL HIGH (ref 70–99)

## 2020-10-27 LAB — GASTRIN: Gastrin: <10 pg/mL (ref 0–115)

## 2020-10-27 MED ORDER — ONDANSETRON HCL 4 MG/2ML IJ SOLN: 4.0000 mg | Freq: Four times a day (QID) | INTRAMUSCULAR | Status: DC | PRN

## 2020-10-27 MED ORDER — LORAZEPAM 1 MG PO TABS: 1.0000 mg | ORAL_TABLET | ORAL | Status: DC | PRN

## 2020-10-27 MED ORDER — MORPHINE SULFATE (PF) 2 MG/ML IV SOLN: 2.0000 mg | INTRAVENOUS | Status: DC | PRN

## 2020-10-27 MED ORDER — MORPHINE SULFATE (PF) 2 MG/ML IV SOLN
1.0000 mg | INTRAVENOUS | Status: DC | PRN
Start: 2020-10-27 — End: 2020-10-27

## 2020-10-27 MED ORDER — LORAZEPAM 2 MG/ML IJ SOLN: 1.0000 mg | INTRAMUSCULAR | Status: DC | PRN

## 2020-10-27 MED ORDER — SODIUM CHLORIDE 0.9 % IV BOLUS
500.0000 mL | Freq: Once | INTRAVENOUS | Status: AC
  Administered 2020-10-27: 500 mL via INTRAVENOUS

## 2020-10-27 MED ORDER — ONDANSETRON 4 MG PO TBDP: 4.0000 mg | ORAL_TABLET | Freq: Four times a day (QID) | ORAL | Status: DC | PRN

## 2020-10-27 MED ORDER — LORAZEPAM 2 MG/ML PO CONC: 1.0000 mg | ORAL | Status: DC | PRN

## 2020-10-29 LAB — VITAMIN B1: Vitamin B1 (Thiamine): 86.7 nmol/L (ref 66.5–200.0)

## 2020-10-30 LAB — METHYLMALONIC ACID, SERUM: Methylmalonic Acid, Quantitative: 250 nmol/L (ref 0–378)

## 2020-11-20 NOTE — Progress Notes (Signed)
Texted Dr. Mal Misty as patient O2 saturation dipped into the mid-60's.  Applied HFNC and NRB both at 15L and O2 sat only came up to 70%.  Capillary refill more than 7 seconds, patient mottling.  Dr. Mal Misty came to floor to assess patient with RT.  Patient place on Heated High Flow

## 2020-11-20 NOTE — Death Summary Note (Signed)
DEATH SUMMARY   Patient Details  Name: Joseph Crawford MRN: [id number] DOB: [birth year]  Admission/Discharge Information   Admit Date:  10/25/2020  Date of Death: Date of Death: 11-10-20  Time of Death: Time of Death: 1500  Length of Stay: 2023-01-20  Referring Physician: Hurshel Party, NP   Reason(s) for Hospitalization  Joseph Crawford  Diagnoses  Preliminary cause of death:  Secondary Diagnoses (including complications and co-morbidities):  Principal Problem:   Acute hypoxemic respiratory failure due to COVID-19 [facility]) Active Problems:   Acute metabolic encephalopathy   Vitamin B12 deficiency   Dysphagia   Primary hypertension   Uncontrolled type 2 diabetes mellitus with hyperglycemia [facility])   Brief Hospital Course (including significant findings, care, treatment, and services provided and events leading to death)  Joseph Crawford is a 70 y.o. year old male with a history of diabetes mellitus, CAD s/p CABG x4, hyperlipidemia, hypertension, tobacco use. Patient presented secondary to worsening dyspnea in setting of COVID-19 pneumonia. Hospitalization complicated by significant encephalopathy of unclear etiology. While admitted, he had an extensive workup performed which was significant for severe vitamin B12 deficiency. Neurology was consulted as well to help with diagnosis and management. While continuing his delirium workup, patient had an acute even causing severe hypoxia which was likely secondary to aspiration. Patient developed severe respiratory failure however goals of care indicated a desire for NO intubation. Family was called to inform them of patient's change in clinical status and recommendation for comfort measures to honor previously decided goals of care. Patient was transitioned to heated high flow nasal canula to allow family to visit and subsequently, patient transitioned to full comfort measures with reduced oxygen and analgesics for agitation/pain/air hunger. Problem  from most recent progress note added below:  Acute respiratory failure with hypoxia Secondary to COVID-19 pneumonia. Required 3 L of oxygen before having an acute event and resultant severe hypoxia which did not respond to increased oxygen flow.  Acute metabolic encephalopathy Possibly secondary to severe vitamin B12 deficiency. EEG without evidence of seizure. MRI without evidence of acute pathology/stroke. ABG without hypercapnia. TSH and folic acid normal. Ammonia slightly elevated at 44. NG tube in place for feeding. Vitamin B12 and high dose thiamine administered.  Vitamin B12 deficiency Severe. Intrinsic factor ab test normal. Vitamin B12 1000 mg subcu daily  COVID-19 pneumonia Chest x-ray with bilateral opacities. Patient treated with Remdesivir, steroids and oxygen. Baricitinib not given secondary to concern for bacterial infection. CRP trended down.  Dysphagia Secondary to encephalopathy. NG tube placed (small bore) and patient managed on tube feeds  Diabetes mellitus, type 2 Levemir and SSI  Primary hypertension Well controlled.  Thrombocytopenia Mild. No active bleeding. In setting of acute infection. Resolved.  GERD No symptoms. Not on pharmacotherapy.  Hypernatremia Mild. Improved with free water.  Superficial venous thrombosis Right upper extremity. Supportive care.  Sinus bradycardia PVCs Frequent NSVT Telemetry    Pertinent Labs and Studies  Significant Diagnostic Studies CT HEAD WO CONTRAST  Result Date: 10/16/2020 CLINICAL DATA:  Altered mental status.  Confusion. EXAM: CT HEAD WITHOUT CONTRAST TECHNIQUE: Contiguous axial images were obtained from the base of the skull through the vertex without intravenous contrast. COMPARISON:  10/13/2020 FINDINGS: Brain: Generalized age related atrophy. Mild chronic small-vessel change of the white matter. No sign of acute infarction, mass lesion, hemorrhage, hydrocephalus or extra-axial collection.  Vascular: There is atherosclerotic calcification of the major vessels at the base of the brain. Skull: Negative Sinuses/Orbits: Clear/normal Other: None IMPRESSION: No acute finding  by CT. Age related atrophy and mild chronic small-vessel change of the white matter. Electronically Signed   By: Paulina Fusi M.D.   On: 10/16/2020 14:59   CT HEAD WO CONTRAST  Result Date: 10/13/2020 CLINICAL DATA:  70 year old male with altered mental status. EXAM: CT HEAD WITHOUT CONTRAST TECHNIQUE: Contiguous axial images were obtained from the base of the skull through the vertex without intravenous contrast. COMPARISON:  Head CT dated 08/23/2012. FINDINGS: Brain: Mild age-related atrophy and chronic microvascular ischemic changes. There is no acute intracranial hemorrhage. No mass effect or midline shift no extra-axial fluid collection. Vascular: No hyperdense vessel or unexpected calcification. Skull: Normal. Negative for fracture or focal lesion. Sinuses/Orbits: No acute finding. Other: None IMPRESSION: 1. No acute intracranial pathology. 2. Mild age-related atrophy and chronic microvascular ischemic changes. Electronically Signed   By: Elgie Collard M.D.   On: 10/13/2020 19:22   MR BRAIN WO CONTRAST  Result Date: 10/24/2020 CLINICAL DATA:  Mental status change, unknown cause. EXAM: MRI HEAD WITHOUT CONTRAST TECHNIQUE: Multiplanar, multiecho pulse sequences of the brain and surrounding structures were obtained without intravenous contrast. COMPARISON:  CT head 10/16/2020.  MRI December 20, 2003. FINDINGS: Motion limited study. Brain: No acute infarction, hemorrhage, hydrocephalus, extra-axial collection or mass lesion. Moderate diffuse cerebral atrophy, progressed since 2005. Mild for age T2/FLAIR hyperintensities in the white matter, likely related to chronic microvascular ischemic disease. Vascular: Major arterial flow voids are maintained at the skull base. Skull and upper cervical spine: Normal marrow signal.  Sinuses/Orbits: Scattered paranasal sinus mucosal thickening with air-fluid levels in the left maxillary sinus and right sphenoid sinus. Other: Moderate bilateral mastoid effusions. IMPRESSION: 1. No evidence of acute intracranial abnormality on this motion limited exam. 2. Nonspecific scattered paranasal sinus mucosal thickening with air-fluid levels in the left maxillary and right sphenoid sinuses. Correlate with the presence or absence of signs/symptoms of sinusitis. 3. Moderate bilateral mastoid effusions. Electronically Signed   By: Feliberto Harts MD   On: 10/24/2020 10:28   DG CHEST PORT 1 VIEW  Result Date: 11/25/20 CLINICAL DATA:  Hypoxia, COVID-19 positive. EXAM: PORTABLE CHEST 1 VIEW COMPARISON:  October 15, 2020. FINDINGS: Stable cardiomegaly. No pneumothorax is noted. Feeding tube is seen entering stomach. Status post coronary bypass graft. Increased bilateral perihilar and basilar opacities are noted concerning for multifocal pneumonia. No significant pleural effusion is noted. Bony thorax is unremarkable. IMPRESSION: Increased bilateral perihilar and basilar opacities are noted concerning for multifocal pneumonia. Electronically Signed   By: Lupita Raider M.D.   On: 11/25/20 10:25   DG CHEST PORT 1 VIEW  Result Date: 10/15/2020 CLINICAL DATA:  Shortness of breath.  COVID positive EXAM: PORTABLE CHEST 1 VIEW COMPARISON:  Four days ago FINDINGS: Hazy opacity in the left more than right lung. Interface over the bilateral chest attributed to skin folds. Normal heart size and mediastinal contours. CABG. IMPRESSION: Mild pneumonia asymmetric to the left base. No significant change from 4 days ago. Electronically Signed   By: Marnee Spring M.D.   On: 10/15/2020 06:24   DG Chest Port 1 View  Result Date: 10/18/2020 CLINICAL DATA:  Shortness of breath, cough positive EXAM: PORTABLE CHEST 1 VIEW COMPARISON:  Chest x-ray 06/01/2013 FINDINGS: The heart size and mediastinal contours are  unchanged. Aortic arch calcifications. Suggestion of a round well-defined 9 mm lesion overlying the right mid lung zone and ribs that is likely benign. Vague patchy airspace opacities at the left base. No focal consolidation. No pulmonary edema. No pleural  effusion. No pneumothorax. No acute osseous abnormality. IMPRESSION: Vague patchy airspace opacities at the left base that could represent a combination of atelectasis and or infection/inflammation. Followup PA and lateral chest X-ray is recommended in 3-4 weeks following therapy to ensure resolution and exclude underlying malignancy. Electronically Signed   By: Tish Frederickson M.D.   On: 10/18/2020 15:55   EEG adult  Result Date: 10/22/2020 Charlsie Quest, MD     10/22/2020  2:27 PM Patient Name: Darrel Gloss MRN: [id number] Epilepsy Attending: Charlsie Quest Referring Physician/Provider: Dr Lynden Oxford Date: 1/31/202 Duration: 23.46 mins Patient history: 70yo M with ams. EEG to evaluate for seizure Level of alertness: Awake AEDs during EEG study: None Technical aspects: This EEG study was done with scalp electrodes positioned according to the 10-20 International system of electrode placement. Electrical activity was acquired at a sampling rate of 500Hz  and reviewed with a high frequency filter of 70Hz  and a low frequency filter of 1Hz . EEG data were recorded continuously and digitally stored. Description: No posterior dominant rhythm was seen. EEG showed continuous generalized 3 to 6 Hz theta-delta slowing. Hyperventilation and photic stimulation were not performed.   ABNORMALITY -Continuous slow, generalized IMPRESSION: This study is suggestive of moderate diffuse encephalopathy, nonspecific etiology. No seizures or epileptiform discharges were seen throughout the recording. Priyanka O Yadav   VAS LOWER EXTREMITY VENOUS (DVT)  Result Date: 10/12/2020  Lower Venous DVT Study Indications: Covid, high d dimer.  Comparison Study: no prior  Performing Technologist: RVS  Examination Guidelines: A complete evaluation includes B-mode imaging, spectral Doppler, color Doppler, and power Doppler as needed of all accessible portions of each vessel. Bilateral testing is considered an integral part of a complete examination. Limited examinations for reoccurring indications may be performed as noted. The reflux portion of the exam is performed with the patient in reverse Trendelenburg.  +---------+---------------+---------+-----------+----------+--------------+ RIGHT    CompressibilityPhasicitySpontaneityPropertiesThrombus Aging +---------+---------------+---------+-----------+----------+--------------+ CFV      Full           Yes      Yes                                 +---------+---------------+---------+-----------+----------+--------------+ SFJ      Full                                                        +---------+---------------+---------+-----------+----------+--------------+ FV Prox  Full                                                        +---------+---------------+---------+-----------+----------+--------------+ FV Mid   Full                                                        +---------+---------------+---------+-----------+----------+--------------+ FV DistalFull                                                        +---------+---------------+---------+-----------+----------+--------------+  PFV      Full                                                        +---------+---------------+---------+-----------+----------+--------------+ POP      Full           Yes      Yes                                 +---------+---------------+---------+-----------+----------+--------------+ PTV      Full                                                        +---------+---------------+---------+-----------+----------+--------------+ PERO     Full                                                         +---------+---------------+---------+-----------+----------+--------------+   +---------+---------------+---------+-----------+----------+--------------+ LEFT     CompressibilityPhasicitySpontaneityPropertiesThrombus Aging +---------+---------------+---------+-----------+----------+--------------+ CFV      Full           Yes      Yes                                 +---------+---------------+---------+-----------+----------+--------------+ SFJ      Full                                                        +---------+---------------+---------+-----------+----------+--------------+ FV Prox  Full                                                        +---------+---------------+---------+-----------+----------+--------------+ FV Mid   Full                                                        +---------+---------------+---------+-----------+----------+--------------+ FV DistalFull                                                        +---------+---------------+---------+-----------+----------+--------------+ PFV      Full                                                        +---------+---------------+---------+-----------+----------+--------------+  POP      Full           Yes      Yes                                 +---------+---------------+---------+-----------+----------+--------------+ PTV      Full                                                        +---------+---------------+---------+-----------+----------+--------------+ PERO     Full                                                        +---------+---------------+---------+-----------+----------+--------------+     Summary: BILATERAL: - No evidence of deep vein thrombosis seen in the lower extremities, bilaterally. - No evidence of superficial venous thrombosis in the lower extremities, bilaterally. -No evidence of popliteal cyst, bilaterally.   *See  table(s) above for measurements and observations. Electronically signed by Waverly Ferrari MD on 10/12/2020 at 3:07:17 PM.    Final    VAS [place] UPPER EXTREMITY VENOUS DUPLEX  Result Date: 10/21/2020 UPPER VENOUS STUDY  Indications: Edema, and Covid-19 Comparison Study: No prior study Performing Technologist: Sherren Kerns RVS  Examination Guidelines: A complete evaluation includes B-mode imaging, spectral Doppler, color Doppler, and power Doppler as needed of all accessible portions of each vessel. Bilateral testing is considered an integral part of a complete examination. Limited examinations for reoccurring indications may be performed as noted.  Right Findings: +----------+------------+---------+-----------+----------+---------------+ RIGHT     CompressiblePhasicitySpontaneousProperties    Summary     +----------+------------+---------+-----------+----------+---------------+ IJV                      Yes       Yes                              +----------+------------+---------+-----------+----------+---------------+ Subclavian    Full       Yes       Yes                              +----------+------------+---------+-----------+----------+---------------+ Axillary                 Yes       Yes                              +----------+------------+---------+-----------+----------+---------------+ Brachial      Full       Yes       Yes                              +----------+------------+---------+-----------+----------+---------------+ Radial  patent by color +----------+------------+---------+-----------+----------+---------------+ Ulnar                                               patent by color +----------+------------+---------+-----------+----------+---------------+ Cephalic      None                                                   +----------+------------+---------+-----------+----------+---------------+ Basilic       Full                                                  +----------+------------+---------+-----------+----------+---------------+  Left Findings: +----------+------------+---------+-----------+----------+-------+ LEFT      CompressiblePhasicitySpontaneousPropertiesSummary +----------+------------+---------+-----------+----------+-------+ Subclavian               Yes       Yes                      +----------+------------+---------+-----------+----------+-------+  Summary:  Right: No evidence of deep vein thrombosis in the upper extremity. Findings consistent with acute superficial vein thrombosis involving the right cephalic vein.  Left: No evidence of thrombosis in the subclavian.  *See table(s) above for measurements and observations.  Diagnosing physician: Heath Larkhomas Hawken Electronically signed by Heath Larkhomas Hawken on 10/21/2020 at 5:55:10 PM.    Final    ECHOCARDIOGRAM LIMITED  Result Date: 10/20/2020    ECHOCARDIOGRAM LIMITED REPORT   Patient Name:   Lequita AsalBRUCE ALLEN Aina Date of Exam: 10/20/2020 Medical Rec #:  [id number]           Height:       72.0 in Accession #:    [id number]          Weight:       163.1 lb Date of Birth:  05-19-1951           BSA:          1.954 m Patient Age:    69 years            BP:           113/85 mmHg Patient Gender: M                   HR:           82 bpm. Exam Location:  Inpatient Procedure: Limited Echo, Limited Color Doppler and Cardiac Doppler Indications:    Ventricular tachycardia  History:        Patient has no prior history of Echocardiogram examinations.                 Prior CABG, Covid; Risk Factors:Diabetes, Dyslipidemia and                 Hypertension.  Sonographer:    Delcie RochLauren Pennington Referring Phys: [id number]: [id number] PRANAV M PATEL  Sonographer Comments: Image acquisition challenging due to uncooperative patient and Image acquisition challenging due to respiratory motion.  IMPRESSIONS  1. Left ventricular ejection fraction, by estimation, is 65 to 70%. The left ventricle has normal function. The left ventricle has no regional wall motion abnormalities. Left ventricular diastolic parameters are consistent  with Grade I diastolic dysfunction (impaired relaxation).  2. Left atrial size was mild to moderately dilated.  3. The mitral valve is normal in structure. No evidence of mitral valve regurgitation.  4. The aortic valve is grossly normal. Aortic valve regurgitation is not visualized. Mild aortic valve sclerosis is present, with no evidence of aortic valve stenosis.  5. Tricuspid regurgitation signal is inadequate for assessing PA pressure. FINDINGS  Left Ventricle: Left ventricular ejection fraction, by estimation, is 65 to 70%. The left ventricle has normal function. The left ventricle has no regional wall motion abnormalities. Left ventricular diastolic parameters are consistent with Grade I diastolic dysfunction (impaired relaxation). Right Ventricle: Tricuspid regurgitation signal is inadequate for assessing PA pressure. Left Atrium: Left atrial size was mild to moderately dilated. Right Atrium: Right atrial size was normal in size. Pericardium: There is no evidence of pericardial effusion. Mitral Valve: The mitral valve is normal in structure. Mild mitral annular calcification. Tricuspid Valve: The tricuspid valve is normal in structure. Tricuspid valve regurgitation is not demonstrated. Aortic Valve: The aortic valve is grossly normal. Aortic valve regurgitation is not visualized. Mild aortic valve sclerosis is present, with no evidence of aortic valve stenosis. Pulmonic Valve: The pulmonic valve was not well visualized. Pulmonic valve regurgitation is not visualized. IAS/Shunts: The interatrial septum was not well visualized. LEFT VENTRICLE PLAX 2D LVIDd:         5.10 cm LVIDs:         2.80 cm LV PW:         1.10 cm LV IVS:        1.00 cm LVOT diam:     2.20 cm LV SV:          73 LV SV Index:   37 LVOT Area:     3.80 cm  IVC IVC diam: 1.70 cm LEFT ATRIUM         Index LA diam:    4.60 cm 2.35 cm/m  AORTIC VALVE LVOT Vmax:   91.40 cm/s LVOT Vmean:  59.000 cm/s LVOT VTI:    0.191 m  AORTA Ao Root diam: 3.40 cm Ao Asc diam:  3.80 cm MITRAL VALVE MV Area (PHT): 3.91 cm    SHUNTS MV Decel Time: 194 msec    Systemic VTI:  0.19 m MV E velocity: 57.80 cm/s  Systemic Diam: 2.20 cm MV A velocity: 75.80 cm/s MV E/A ratio:  0.76 Mihai Croitoru MD Electronically signed by Thurmon Fair MD Signature Date/Time: 10/20/2020/1:30:35 PM    Final     Microbiology No results found for this or any previous visit (from the past 240 hour(s)).  Lab Basic Metabolic Panel: Recent Labs  Lab 10/23/20 0259 10/24/20 0245 10/25/20 0228 10/26/20 0321 Nov 01, 2020 0441  NA 143 141 142 140 139  K 3.8 3.8 3.9 4.0 4.8  CL 104 102 101 100 98  CO2 32 30 34* 31 33*  GLUCOSE 223* 134* 170* 205* 201*  BUN 17 18 19 18 18   CREATININE 0.81 0.70 0.72 0.66 0.66  CALCIUM 9.8 9.5 9.6 9.7 9.8  MG 2.2 2.1 2.1 2.1 2.2   Liver Function Tests: No results for input(s): AST, ALT, ALKPHOS, BILITOT, PROT, ALBUMIN in the last 168 hours. No results for input(s): LIPASE, AMYLASE in the last 168 hours. No results for input(s): AMMONIA in the last 168 hours. CBC: Recent Labs  Lab 10/23/20 0259 10/24/20 0245 10/25/20 0228 10/26/20 0321 November 01, 2020 0441  WBC 10.6* 10.5 9.8 12.1* 17.8*  HGB 9.5* 9.1*  9.1* 9.4* 9.5*  HCT 28.3* 27.7* 27.6* 27.6* 28.8*  MCV 107.6* 109.9* 110.0* 111.3* 112.1*  PLT 177 186 210 228 230   Cardiac Enzymes: No results for input(s): CKTOTAL, CKMB, CKMBINDEX, TROPONINI in the last 168 hours. Sepsis Labs: Recent Labs  Lab 10/24/20 0245 10/25/20 0228 10/26/20 0321 10/28/2020 0441  WBC 10.5 9.8 12.1* 17.8*    Procedures/Operations     Jacquelin Hawking, MD 10/29/2020, 2:01 PM

## 2020-11-20 NOTE — Progress Notes (Signed)
Patient's three daughters arrived to say goodbye to their Dad.  Daughter's left around 14:25

## 2020-11-20 NOTE — Progress Notes (Signed)
Spoke with daughter Misty Stanley and updated her on her father's current status.  Sent page to Dr. Mal Misty:  [phone] [place]:Daughter, Misty Stanley [phone], awaiting your call. Wants to see Dad. Sounded ok w/comfort care. Please call.

## 2020-11-20 NOTE — Progress Notes (Signed)
Sent text to Dr. Caleb Popp:  Saw [facility] orders, what are we doing with [facility]? Daughter not here yet, but families often don't want [place] to titrate down

## 2020-11-20 DEATH — deceased

## 2022-09-19 IMAGING — CT CT HEAD W/O CM
3 series · 15 of 47 positions shown, 18 images · non-contrast
Comparison: Head CT dated 08/23/2012.

CLINICAL DATA: 69-year-old male with altered mental status.

EXAM:
CT HEAD WITHOUT CONTRAST
TECHNIQUE: Contiguous axial images were obtained from the base of the skull
through the vertex without intravenous contrast.

[Series 3: head 5.0 h30s · axial · 0.43mm/px · z∈[+1074,+1219]mm · 9 of 35 slices shown, 12 images]
[im 3/35  brain]
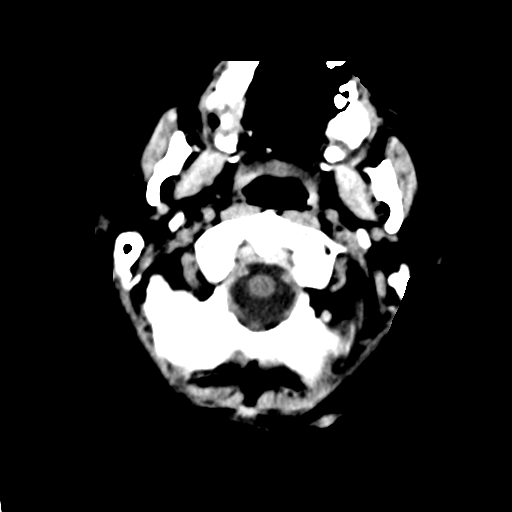
[im 3/35  bone]
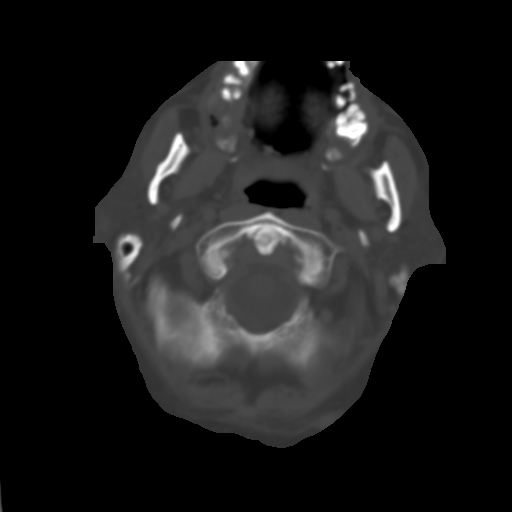
[im 6/35  brain]
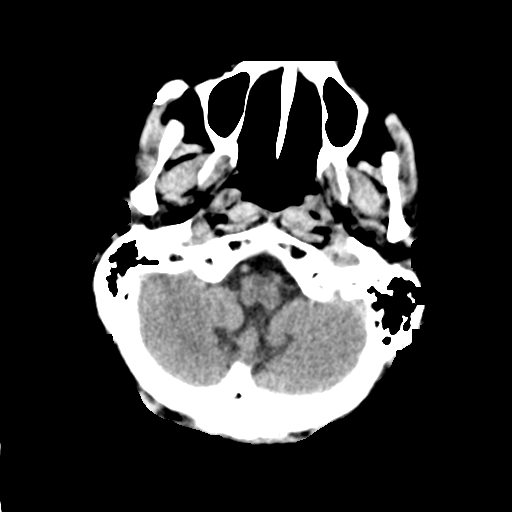
[im 10/35  brain]
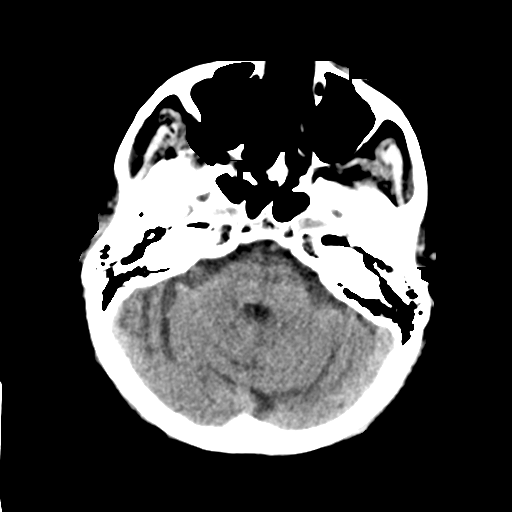
[im 13/35  brain]
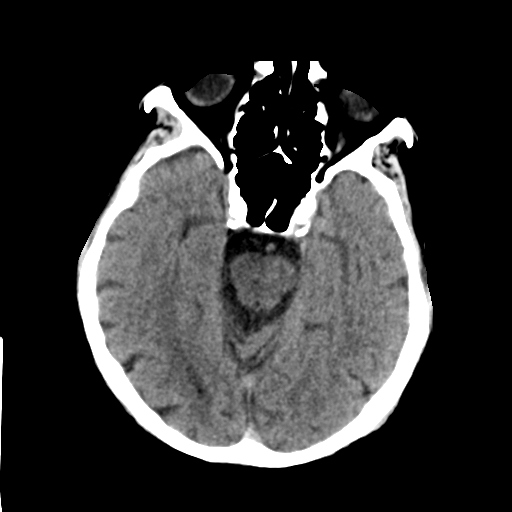
[im 18/35  brain]
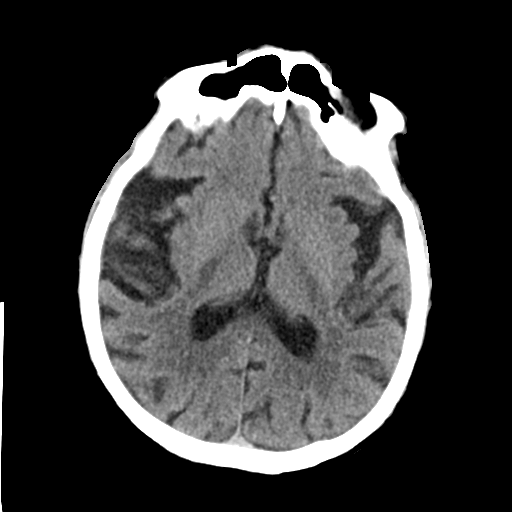
[im 18/35  bone]
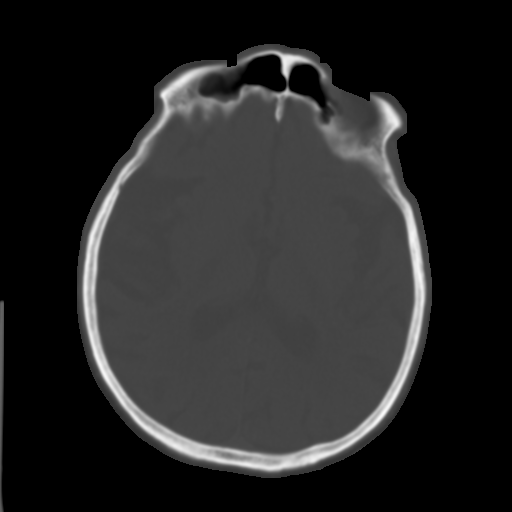
[im 22/35  brain]
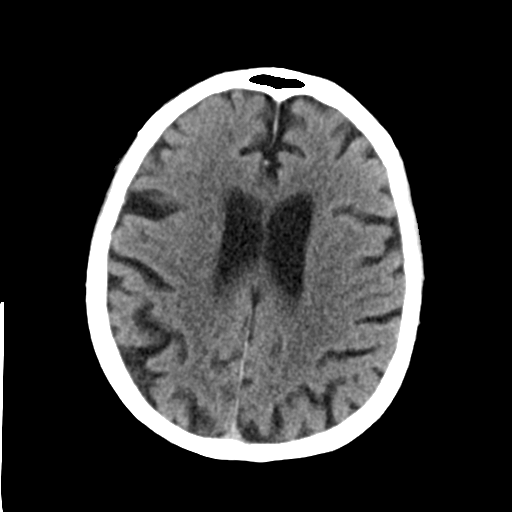
[im 25/35  brain]
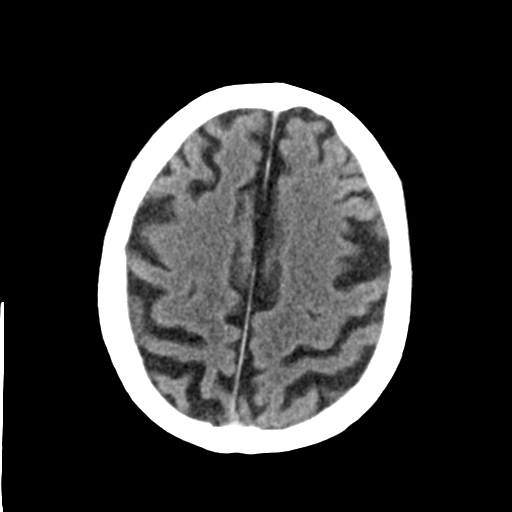
[im 29/35  brain]
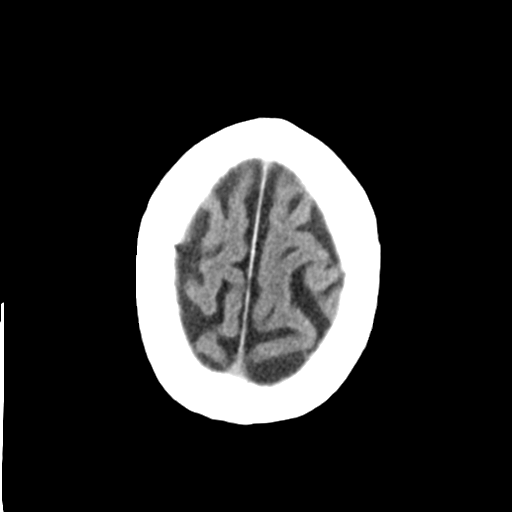
[im 32/35  brain]
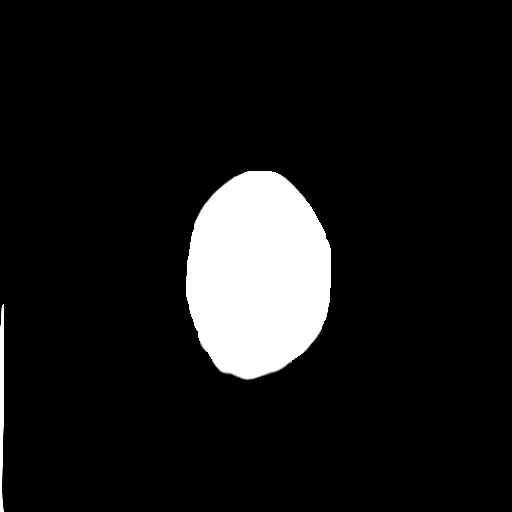
[im 32/35  bone]
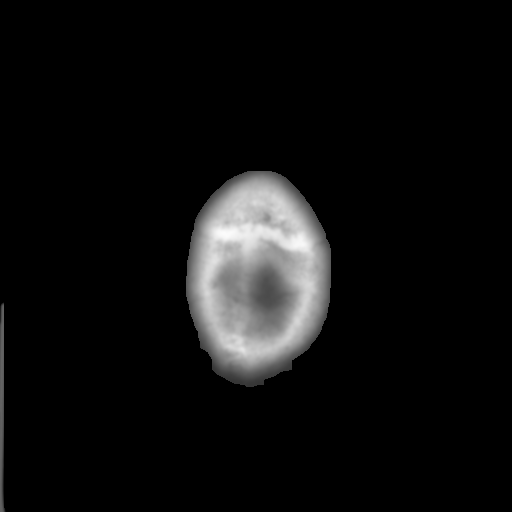

[Series 5: head 3.0 mpr cor · coronal · 0.34mm/px · 3 of 67 slices shown]
[im 23/67  brain]
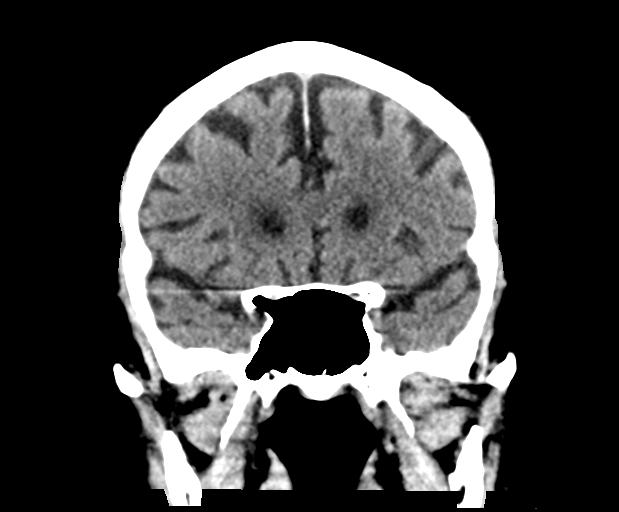
[im 30/67  brain]
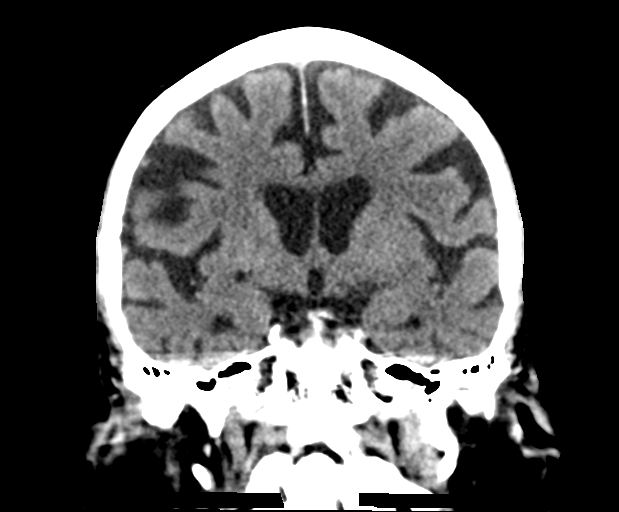
[im 37/67  brain]
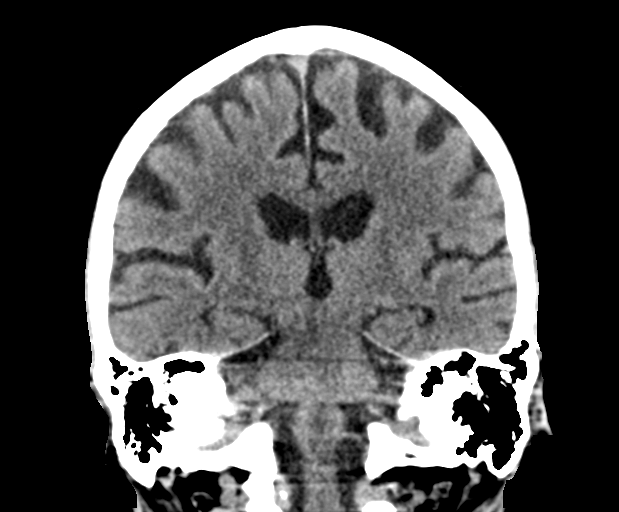

[Series 6: head 3.0 mpr sag · sagittal · 0.34mm/px · 3 of 61 slices shown]
[im 21/61  brain]
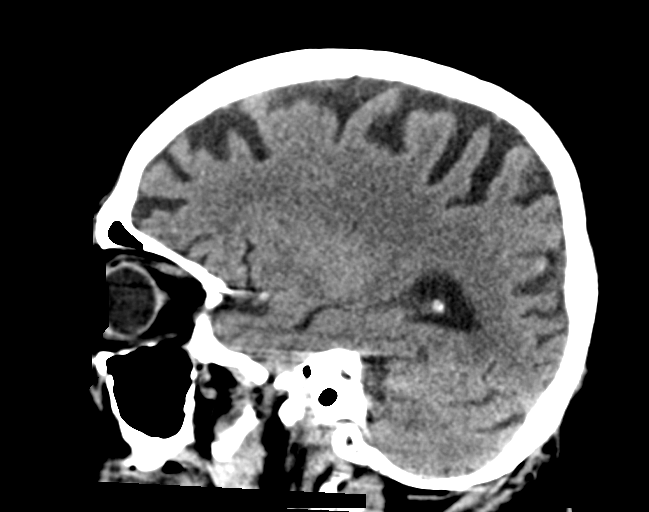
[im 31/61  brain]
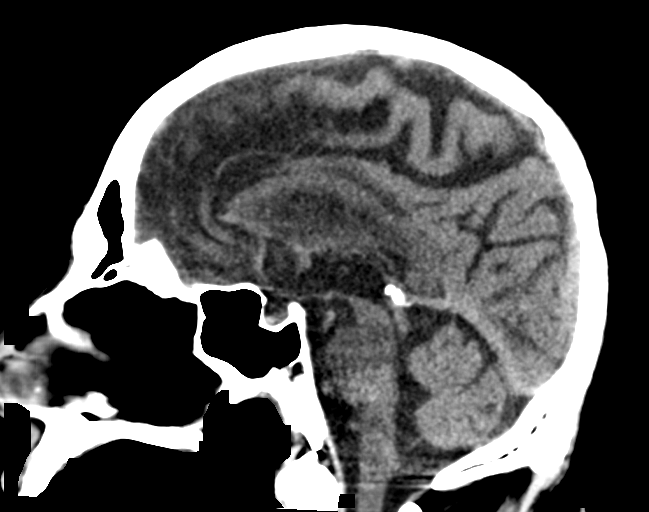
[im 41/61  brain]
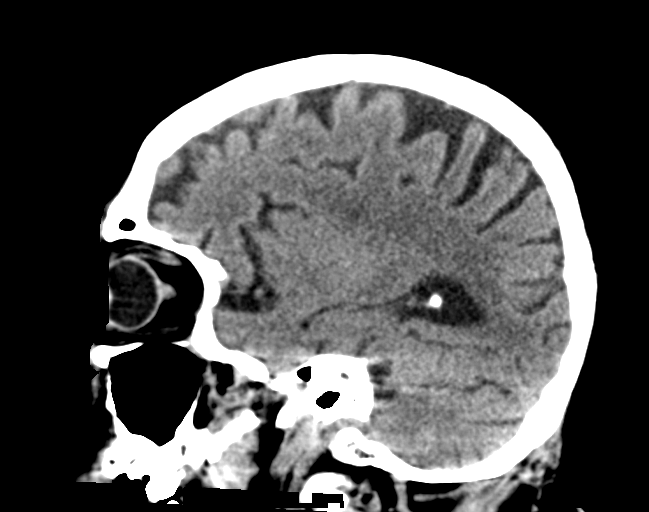

[15 of 47 positions shown; findings below may reference images not displayed]

FINDINGS: Brain: Mild age-related atrophy and chronic microvascular ischemic
changes. There is no acute intracranial hemorrhage. No mass effect
or midline shift no extra-axial fluid collection.

Vascular: No hyperdense vessel or unexpected calcification.

Skull: Normal. Negative for fracture or focal lesion.

Sinuses/Orbits: No acute finding.

Other: None
IMPRESSION: 1. No acute intracranial pathology.
2. Mild age-related atrophy and chronic microvascular ischemic
changes.
